# Patient Record
Sex: Female | Born: 1990 | Race: Black or African American | Hispanic: No | Marital: Single | Smoking: Never smoker
Health system: Southern US, Community
[De-identification: ages and names within clinical notes are randomized; demographics above are authoritative.]

## PROBLEM LIST (undated history)

## (undated) DIAGNOSIS — A6000 Herpesviral infection of urogenital system, unspecified: Secondary | ICD-10-CM

## (undated) HISTORY — PX: OTHER SURGICAL HISTORY: SHX169

## (undated) HISTORY — DX: Herpesviral infection of urogenital system, unspecified: A60.00

---
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
## 2013-07-29 ENCOUNTER — Emergency Department: Payer: Self-pay | Admitting: Emergency Medicine

## 2013-07-29 LAB — CBC
HGB: 13 g/dL (ref 12.0–16.0)
MCH: 25.5 pg — ABNORMAL LOW (ref 26.0–34.0)
MCV: 80 fL (ref 80–100)
RDW: 15 % — ABNORMAL HIGH (ref 11.5–14.5)

## 2013-07-29 LAB — WET PREP, GENITAL

## 2013-07-29 LAB — URINALYSIS, COMPLETE
Bilirubin,UR: NEGATIVE
Blood: NEGATIVE
Ketone: NEGATIVE
Nitrite: NEGATIVE
Protein: NEGATIVE
RBC,UR: 3 /HPF (ref 0–5)
Specific Gravity: 1.015 (ref 1.003–1.030)
WBC UR: 67 /HPF (ref 0–5)

## 2013-07-29 LAB — COMPREHENSIVE METABOLIC PANEL
Albumin: 4.3 g/dL (ref 3.4–5.0)
Alkaline Phosphatase: 73 U/L (ref 50–136)
Anion Gap: 5 — ABNORMAL LOW (ref 7–16)
Bilirubin,Total: 0.2 mg/dL (ref 0.2–1.0)
Calcium, Total: 9.5 mg/dL (ref 8.5–10.1)
Chloride: 103 mmol/L (ref 98–107)
Co2: 27 mmol/L (ref 21–32)
Creatinine: 0.78 mg/dL (ref 0.60–1.30)
EGFR (African American): >60
SGOT(AST): 20 U/L (ref 15–37)
Sodium: 135 mmol/L — ABNORMAL LOW (ref 136–145)

## 2013-07-29 LAB — LIPASE, BLOOD: Lipase: 174 U/L (ref 73–393)

## 2013-07-31 LAB — URINE CULTURE

## 2013-08-21 ENCOUNTER — Emergency Department: Payer: Self-pay | Admitting: Emergency Medicine

## 2013-08-22 LAB — URINALYSIS, COMPLETE
Bilirubin,UR: NEGATIVE
Glucose,UR: NEGATIVE mg/dL (ref 0–75)
Ketone: NEGATIVE
Nitrite: NEGATIVE
Specific Gravity: 1.027 (ref 1.003–1.030)
WBC UR: 23 /HPF (ref 0–5)

## 2013-08-23 LAB — URINE CULTURE

## 2014-03-18 ENCOUNTER — Emergency Department: Payer: Self-pay | Admitting: Emergency Medicine

## 2014-03-18 LAB — URINALYSIS, COMPLETE
Bilirubin,UR: NEGATIVE
Blood: NEGATIVE
GLUCOSE, UR: NEGATIVE mg/dL (ref 0–75)
Ketone: NEGATIVE
Leukocyte Esterase: NEGATIVE
NITRITE: NEGATIVE
PH: 6 (ref 4.5–8.0)
PROTEIN: NEGATIVE
SPECIFIC GRAVITY: 1.009 (ref 1.003–1.030)
Squamous Epithelial: 5
WBC UR: 1 /HPF (ref 0–5)

## 2014-03-23 ENCOUNTER — Emergency Department: Payer: Self-pay | Admitting: Emergency Medicine

## 2014-03-23 LAB — CBC WITH DIFFERENTIAL/PLATELET
BASOS ABS: 0 10*3/uL (ref 0.0–0.1)
Basophil %: 0.4 %
EOS PCT: 2.5 %
Eosinophil #: 0.2 10*3/uL (ref 0.0–0.7)
HCT: 38.8 % (ref 35.0–47.0)
HGB: 12.4 g/dL (ref 12.0–16.0)
Lymphocyte #: 2.9 10*3/uL (ref 1.0–3.6)
Lymphocyte %: 31.6 %
MCH: 26 pg (ref 26.0–34.0)
MCHC: 31.9 g/dL — AB (ref 32.0–36.0)
MCV: 82 fL (ref 80–100)
MONOS PCT: 8.5 %
Monocyte #: 0.8 x10 3/mm (ref 0.2–0.9)
Neutrophil #: 5.2 10*3/uL (ref 1.4–6.5)
Neutrophil %: 57 %
PLATELETS: 312 10*3/uL (ref 150–440)
RBC: 4.76 10*6/uL (ref 3.80–5.20)
RDW: 16.3 % — ABNORMAL HIGH (ref 11.5–14.5)
WBC: 9.2 10*3/uL (ref 3.6–11.0)

## 2014-03-23 LAB — COMPREHENSIVE METABOLIC PANEL
ALBUMIN: 3.6 g/dL (ref 3.4–5.0)
ALK PHOS: 43 U/L — AB
ALT: 16 U/L (ref 12–78)
AST: 14 U/L — AB (ref 15–37)
Anion Gap: 7 (ref 7–16)
BUN: 6 mg/dL — AB (ref 7–18)
Bilirubin,Total: 0.2 mg/dL (ref 0.2–1.0)
CO2: 25 mmol/L (ref 21–32)
Calcium, Total: 8.5 mg/dL (ref 8.5–10.1)
Chloride: 105 mmol/L (ref 98–107)
Creatinine: 0.45 mg/dL — ABNORMAL LOW (ref 0.60–1.30)
GLUCOSE: 89 mg/dL (ref 65–99)
OSMOLALITY: 271 (ref 275–301)
Potassium: 3.8 mmol/L (ref 3.5–5.1)
Sodium: 137 mmol/L (ref 136–145)
Total Protein: 7.8 g/dL (ref 6.4–8.2)

## 2014-03-23 LAB — HCG, QUANTITATIVE, PREGNANCY: Beta Hcg, Quant.: 8445 m[IU]/mL — ABNORMAL HIGH

## 2015-10-25 IMAGING — US US OB < 14 WEEKS - US OB TV
1 series · 13 of 28 positions shown · non-contrast
Comparison: None.

CLINICAL DATA: Vaginal bleeding since this morning. Abdominal
discomfort. Estimated gestational age by LMP is 12 weeks 1 day.
Quantitative beta HCG is [DATE].

EXAM:
OBSTETRIC <14 WK US AND TRANSVAGINAL OB US
TECHNIQUE: Both transabdominal and transvaginal ultrasound examinations were
performed for complete evaluation of the gestation as well as the
maternal uterus, adnexal regions, and pelvic cul-de-sac.
Transvaginal technique was performed to assess early pregnancy.

[Series 1: us ob < 14 weeks - us ob tv · 0.22mm/px · 13 of 93 slices shown]
[im 4/93]
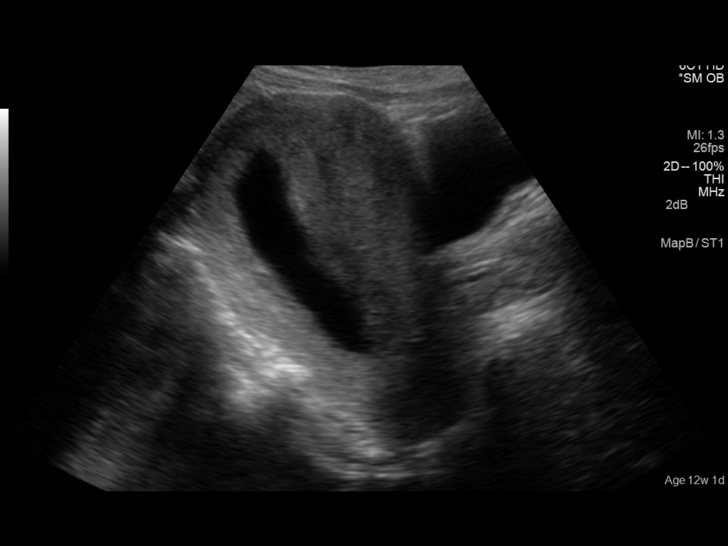
[im 11/93]
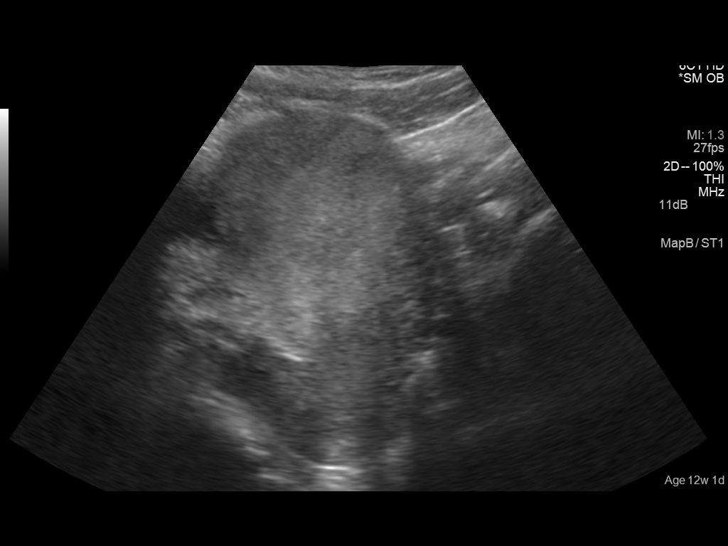
[im 18/93]
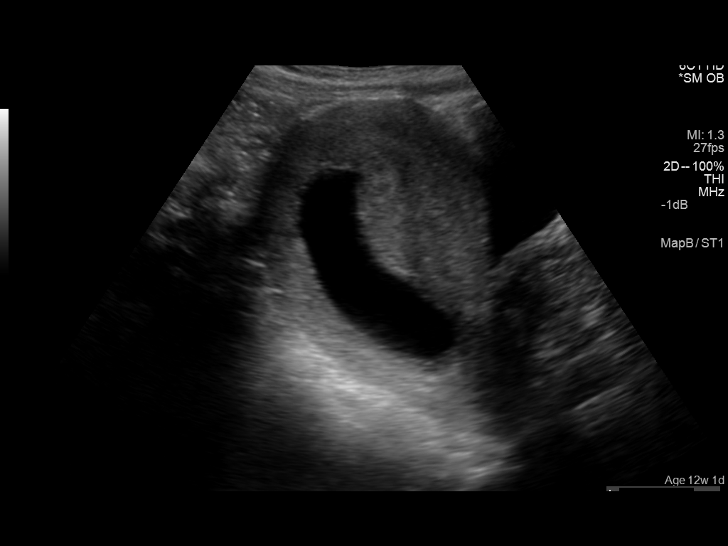
[im 24/93]
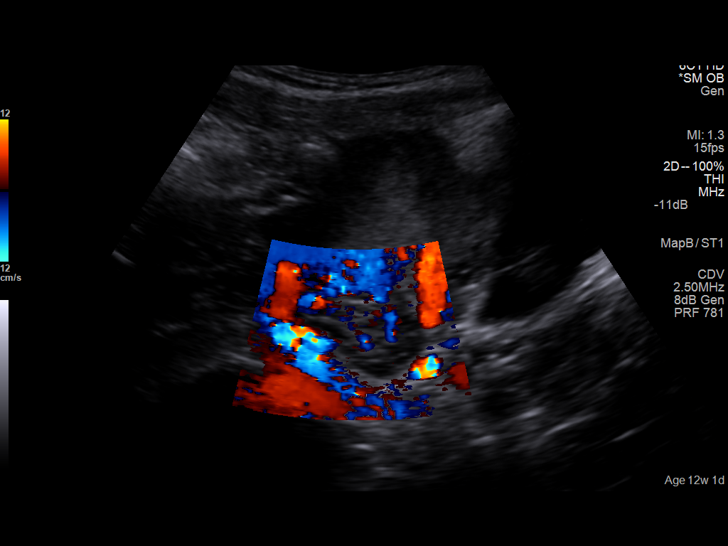
[im 31/93]
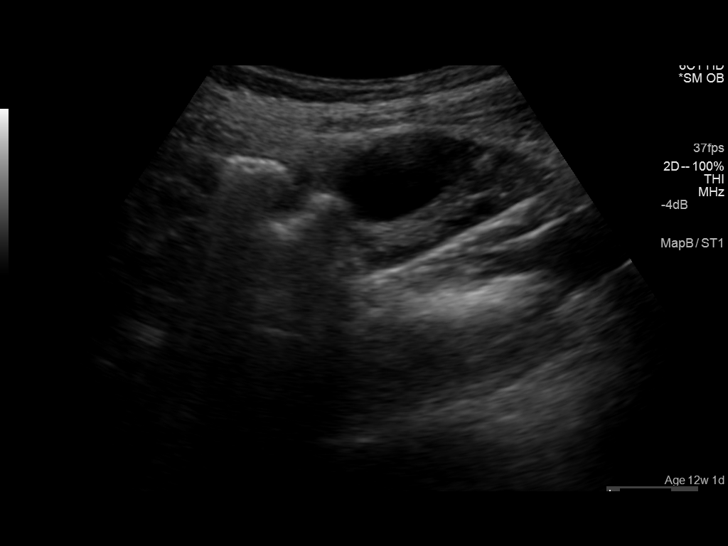
[im 38/93]
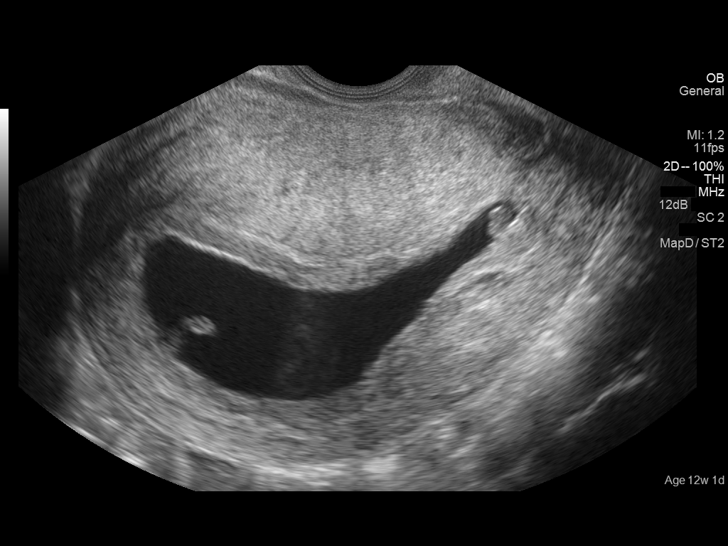
[im 48/93]
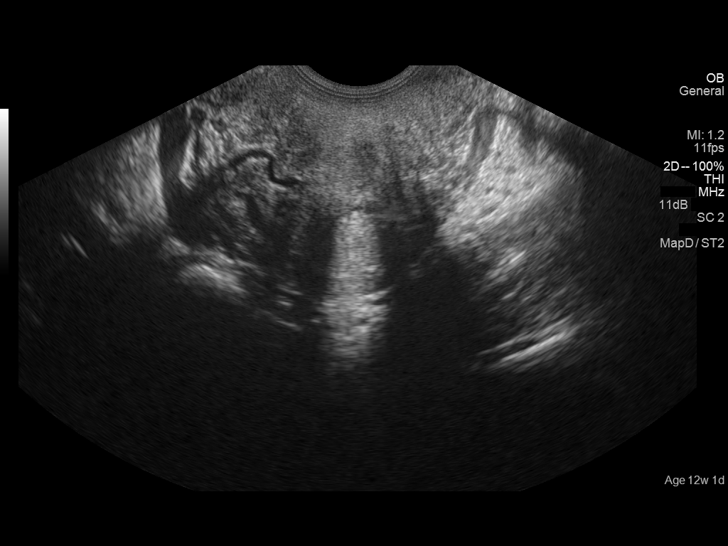
[im 55/93]
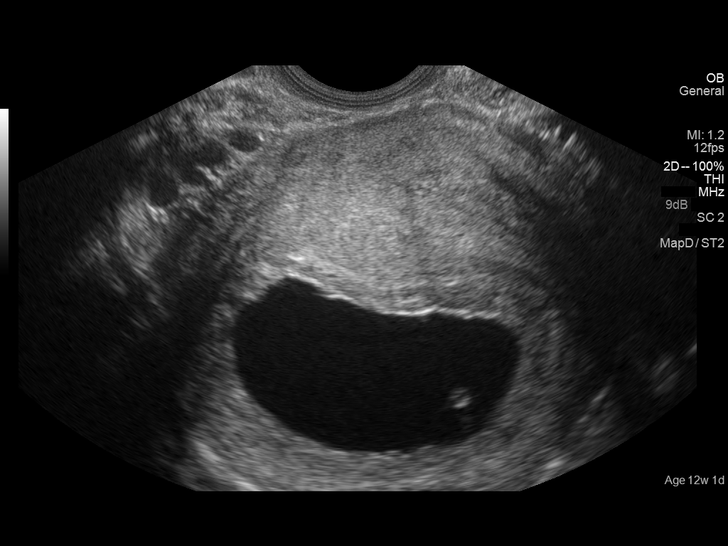
[im 62/93]
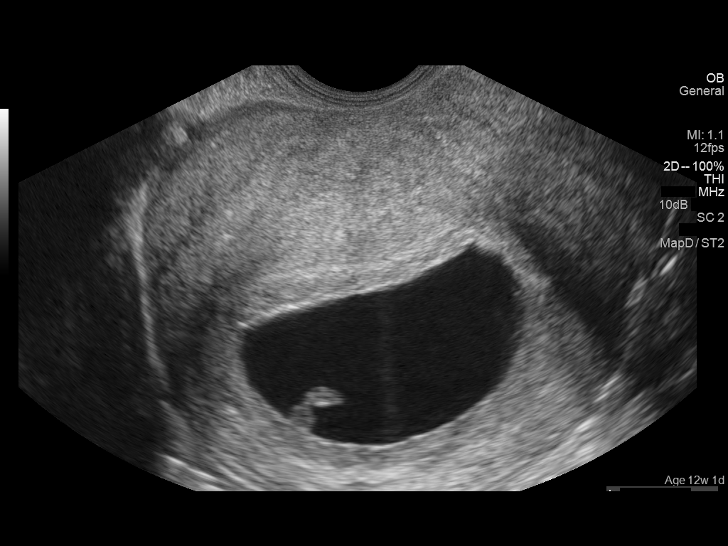
[im 69/93]
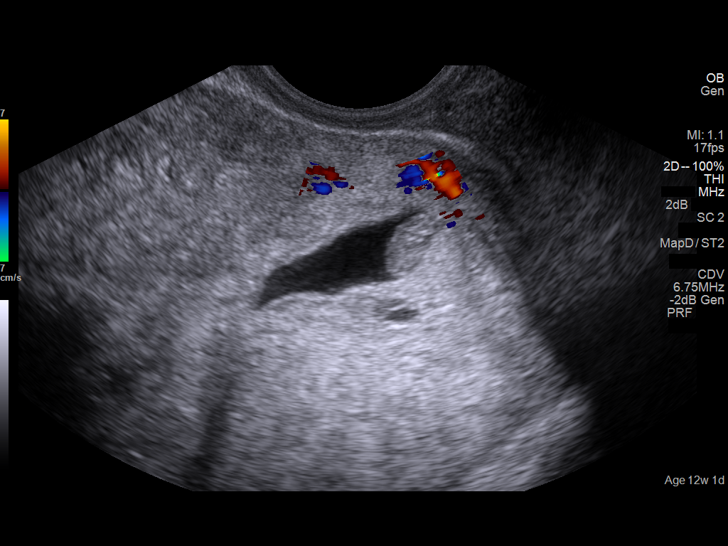
[im 75/93]
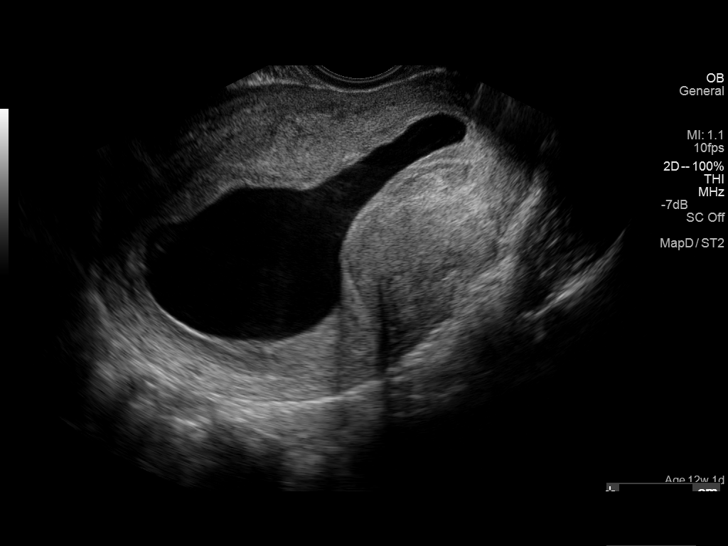
[im 82/93]
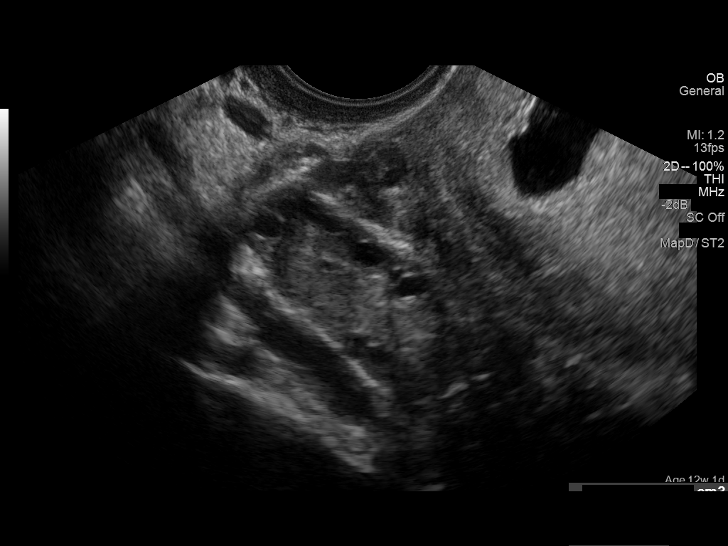
[im 89/93]
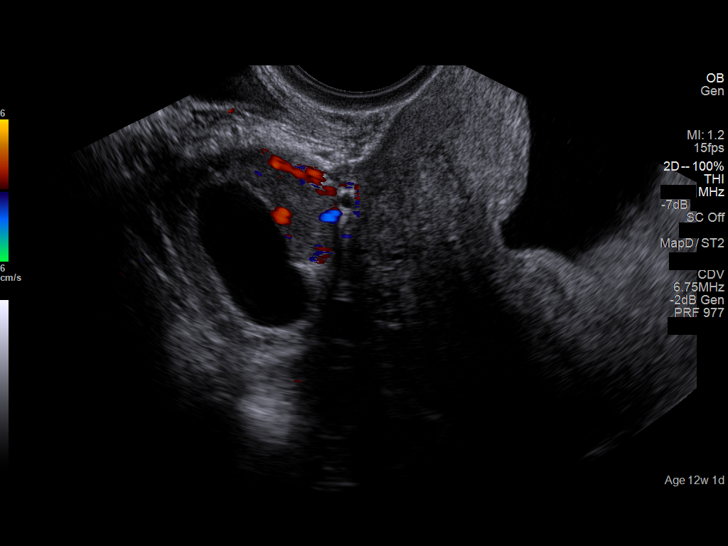

[13 of 28 positions shown; findings below may reference images not displayed]

FINDINGS: Intrauterine gestational sac: Single intrauterine gestational sac is
visualized. Sac appears to extend down into the lower uterine
segment with a filling defect suggesting a blood clot.

Yolk sac:  Probable yolk sac demonstrated.

Embryo:  Fetal pole is identified.

Cardiac Activity: No fetal cardiac activity identified.

MSD:  48  mm   10 w   3 d        US EDC:  10/17/2014

CRL:   6  mm   6 w 3 d                  US EDC: 11/13/2014

Maternal uterus/adnexae: No subchorionic hemorrhage. Both ovaries
are visualized with normal follicular changes present. Simple
appearing cyst in the left ovary measuring 2.8 cm maximal diameter,
likely benign.
IMPRESSION: Fetal pole demonstrated, small for LMP and small for mean sac
diameter. No fetal cardiac activity identified. Findings are
suspicious but not yet definitive for failed pregnancy. Recommend
follow-up US in 10-14 days for definitive diagnosis. This
recommendation follows SRU consensus guidelines: Diagnostic Criteria
for Nonviable Pregnancy Early in the First Trimester. N Engl J Med

## 2016-07-10 ENCOUNTER — Emergency Department
Admission: EM | Admit: 2016-07-10 | Discharge: 2016-07-10 | Disposition: A | Discharge status: Home / Self Care | Payer: Worker's Compensation | Attending: Emergency Medicine | Admitting: Emergency Medicine

## 2016-07-10 ENCOUNTER — Encounter: Payer: Self-pay | Admitting: *Deleted

## 2016-07-10 DIAGNOSIS — M778 Other enthesopathies, not elsewhere classified: Secondary | ICD-10-CM | POA: Diagnosis not present

## 2016-07-10 DIAGNOSIS — M25531 Pain in right wrist: Secondary | ICD-10-CM | POA: Diagnosis present

## 2016-07-10 MED ORDER — MELOXICAM 15 MG PO TABS: 15.0000 mg | ORAL_TABLET | Freq: Every day | ORAL | 0 refills | Status: DC

## 2016-07-10 NOTE — ED Triage Notes (Signed)
Pt has pain in left wrist.  Pt also reports pain in right wrist.  States does a lot of same movement at work.  States WC.  No swelling noted.

## 2016-07-10 NOTE — ED Notes (Signed)
Pt present with pain in left wrist.  Pt reports pain in right wrist.  Pt states does a lot of same movement at work.  Pt requesting to file WC.  No swelling noted.

## 2016-07-10 NOTE — Discharge Instructions (Signed)
Follow up with orthopedics for symptoms that are not improving over the week. °Return to the ER for symptoms that change or worsen if unable to schedule an appointment. °

## 2016-07-10 NOTE — ED Provider Notes (Signed)
[facility] Emergency Department Provider Note ____________________________________________  Time seen: Approximately 11:07 PM  I have reviewed the triage vital signs and the nursing notes.   HISTORY  Chief Complaint Wrist Pain    HPI Karen Palmer is a 25 y.o. female who presents to the emergency department for evaluation of bilateral wrist pain. She denies specific injury. She states that she does a lot of repetitive motion at work.She denies specific injury. She requests a note to be moved to a different area at work. She has not taken anything for pain.  No past medical history on file.  There are no active problems to display for this patient.   No past surgical history on file.  Prior to Admission medications   Medication Sig Start Date End Date Taking? Authorizing Provider  meloxicam (MOBIC) 15 MG tablet Take 1 tablet (15 mg total) by mouth daily. 07/10/16   Chinita Pester, FNP    Allergies Review of patient's allergies indicates no known allergies.  No family history on file.  Social History Social History  Substance Use Topics  . Smoking status: Never Smoker  . Smokeless tobacco: Never Used  . Alcohol use No    Review of Systems Constitutional: No recent illness. Cardiovascular: Denies chest pain or palpitations. Respiratory: Denies shortness of breath. Musculoskeletal: Pain in bilateral wrists, worse on the left. Skin: Negative for rash, wound, lesion. Neurological: Negative for focal weakness or numbness.  ____________________________________________   PHYSICAL EXAM:  VITAL SIGNS: ED Triage Vitals [07/10/16 2252]  Enc Vitals Group     BP (!) 127/92     Pulse Rate 60     Resp 16     Temp 98.4 F (36.9 C)     Temp Source Oral     SpO2 99 %     Weight 125 lb (56.7 kg)     Height 5\' 1"  (1.549 m)     Head Circumference      Peak Flow      Pain Score      Pain Loc      Pain Edu?      Excl. in GC?      Constitutional: Alert and oriented. Well appearing and in no acute distress. Eyes: Conjunctivae are normal. EOMI. Head: Atraumatic. Neck: No stridor.  Respiratory: Normal respiratory effort.   Musculoskeletal: Full ROM of bilateral wrists and hands. Pain reproducible by rotation of the left wrist and flexing left hand. Neurologic:  Normal speech and language. No gross focal neurologic deficits are appreciated. Speech is normal. No gait instability. Skin:  Skin is warm, dry and intact. Atraumatic. No swelling Psychiatric: Mood and affect are normal. Speech and behavior are normal.  ____________________________________________   LABS (all labs ordered are listed, but only abnormal results are displayed)  Labs Reviewed - No data to display ____________________________________________  RADIOLOGY  Not indicated ____________________________________________   PROCEDURES  Procedure(s) performed: Velcro wrist splint applied to the left wrist by ER tech.   ____________________________________________   INITIAL IMPRESSION / ASSESSMENT AND PLAN / ED COURSE  Clinical Course    Pertinent labs & imaging results that were available during my care of the patient were reviewed by me and considered in my medical decision making (see chart for details).  Patient will be prescribed meloxicam for pain. She was advised to follow-up with orthopedics for symptoms that are not improving over the week. She was advised to return to the emergency department for symptoms that change or worsen if  she is unable schedule an appointment with primary care orthopedics. ____________________________________________   FINAL CLINICAL IMPRESSION(S) / ED DIAGNOSES  Final diagnoses:  Tendonitis of both wrists       Chinita PesterCari B Greggory Safranek, FNP 07/10/16 [id number]    Minna AntisKevin Paduchowski, MD 07/11/16 [phone]

## 2016-07-10 NOTE — ED Notes (Signed)
Discharge instructions reviewed with patient. Questions fielded by this RN. Patient verbalizes understanding of instructions. Patient discharged home in stable condition per Triplett NP. No acute distress noted at time of discharge.   

## 2016-07-10 NOTE — ED Notes (Signed)
Worker's comp completed. Patient name and date of birth verified. Urine processed successfully and taken to lab.

## 2017-01-25 ENCOUNTER — Encounter: Payer: Self-pay | Admitting: Emergency Medicine

## 2017-01-25 ENCOUNTER — Emergency Department
Admission: EM | Admit: 2017-01-25 | Discharge: 2017-01-25 | Disposition: A | Discharge status: Home / Self Care | Payer: Self-pay | Attending: Emergency Medicine | Admitting: Emergency Medicine

## 2017-01-25 DIAGNOSIS — Z79899 Other long term (current) drug therapy: Secondary | ICD-10-CM | POA: Insufficient documentation

## 2017-01-25 DIAGNOSIS — B852 Pediculosis, unspecified: Secondary | ICD-10-CM

## 2017-01-25 DIAGNOSIS — B850 Pediculosis due to Pediculus humanus capitis: Secondary | ICD-10-CM

## 2017-01-25 MED ORDER — PERMETHRIN 1 % EX LOTN: 1.0000 "application " | TOPICAL_LOTION | Freq: Once | CUTANEOUS | 0 refills | Status: AC

## 2017-01-25 NOTE — ED Provider Notes (Signed)
[facility] Emergency Department Provider Note  ____________________________________________  Time seen: Approximately 9:09 PM  I have reviewed the triage vital signs and the nursing notes.   HISTORY  Chief Complaint Head Lice    HPI Laurene FootmanShaquelia Ericsson is a 26 y.o. female who presents emergency department for complaint of possible lice. She reports that her daughter was sent home with a note from school stating that child had lice. She has looked at her daughter's head and has visualized lice and nits. Patient reports that she has visualized notes in her own here. She reports that her scalp itches. No complaints at this time. No medication prior to arrival.   History reviewed. No pertinent past medical history.  There are no active problems to display for this patient.   History reviewed. No pertinent surgical history.  Prior to Admission medications   Medication Sig Start Date End Date Taking? Authorizing Provider  meloxicam (MOBIC) 15 MG tablet Take 1 tablet (15 mg total) by mouth daily. 07/10/16   Chinita Pesterari B Triplett, FNP  permethrin (PERMETHRIN LICE TREATMENT) 1 % lotion Apply 1 application topically once. Shampoo, rinse and towel dry hair, saturate hair and scalp with permethrin. Use over other affected surfaces. Rinse after 10 min; repeat in 1 week if needed 01/25/17 01/25/17  Delorise RoyalsJonathan D London Nonaka, PA-C    Allergies Patient has no known allergies.  History reviewed. No pertinent family history.  Social History Social History  Substance Use Topics  . Smoking status: Never Smoker  . Smokeless tobacco: Never Used  . Alcohol use No     Review of Systems  Constitutional: No fever/chills Eyes: No visual changes. No discharge ENT: No upper respiratory complaints. Cardiovascular: no chest pain. Respiratory: no cough. No SOB. Gastrointestinal: No abdominal pain.  No nausea, no vomiting.  Musculoskeletal: Negative for musculoskeletal pain. Skin: She  reports lice and nits to hear. Neurological: Negative for headaches, focal weakness or numbness. 10-point ROS otherwise negative.  ____________________________________________   PHYSICAL EXAM:  VITAL SIGNS: ED Triage Vitals [01/25/17 2035]  Enc Vitals Group     BP 124/80     Pulse Rate 65     Resp 16     Temp 98.3 F (36.8 C)     Temp Source Oral     SpO2 99 %     Weight 130 lb (59 kg)     Height 5\' 1"  (1.549 m)     Head Circumference      Peak Flow      Pain Score      Pain Loc      Pain Edu?      Excl. in GC?      Constitutional: Alert and oriented. Well appearing and in no acute distress. Eyes: Conjunctivae are normal. PERRL. EOMI. Head: Atraumatic. Visualization of the scalp reveals both lice and nits. ENT:      Ears:       Nose: No congestion/rhinnorhea.      Mouth/Throat: Mucous membranes are moist.  Neck: No stridor.    Cardiovascular: Normal rate, regular rhythm. Normal S1 and S2.  Good peripheral circulation. Respiratory: Normal respiratory effort without tachypnea or retractions. Lungs CTAB. Good air entry to the bases with no decreased or absent breath sounds. Musculoskeletal: Full range of motion to all extremities. No gross deformities appreciated. Neurologic:  Normal speech and language. No gross focal neurologic deficits are appreciated.  Skin:  Skin is warm, dry and intact. No rash noted. Psychiatric: Mood and affect are  normal. Speech and behavior are normal. Patient exhibits appropriate insight and judgement.   ____________________________________________   LABS (all labs ordered are listed, but only abnormal results are displayed)  Labs Reviewed - No data to display ____________________________________________  EKG   ____________________________________________  RADIOLOGY   No results found.  ____________________________________________    PROCEDURES  Procedure(s) performed:    Procedures    Medications - No data to  display   ____________________________________________   INITIAL IMPRESSION / ASSESSMENT AND PLAN / ED COURSE  Pertinent labs & imaging results that were available during my care of the patient were reviewed by me and considered in my medical decision making (see chart for details).  Review of the [place] CSRS was performed in accordance of the [facility] prior to dispensing any controlled drugs.     Patient's diagnosis is consistent with lice infected hair.. Patient will be discharged home with prescriptions for permethrin. Patient is to follow up with primary care as needed or otherwise directed. Patient is given ED precautions to return to the ED for any worsening or new symptoms.     ____________________________________________  FINAL CLINICAL IMPRESSION(S) / ED DIAGNOSES  Final diagnoses:  Lice infested hair      NEW MEDICATIONS STARTED DURING THIS VISIT:  Discharge Medication List as of 01/25/2017  9:10 PM    START taking these medications   Details  permethrin (PERMETHRIN LICE TREATMENT) 1 % lotion Apply 1 application topically once. Shampoo, rinse and towel dry hair, saturate hair and scalp with permethrin. Use over other affected surfaces. Rinse after 10 min; repeat in 1 week if needed, Starting Thu 01/25/2017, Print            This chart was dictated using voice recognition software/Dragon. Despite best efforts to proofread, errors can occur which can change the meaning. Any change was purely unintentional.    Racheal Patches, PA-C 01/25/17 2328    Jene Every, MD 01/29/17 [phone]

## 2017-01-25 NOTE — ED Triage Notes (Signed)
Pt ambulatory to tx room in NAD, report daughter sent home w/ note that other student found to have lice and family needs to be checked out

## 2018-04-13 DIAGNOSIS — A6000 Herpesviral infection of urogenital system, unspecified: Secondary | ICD-10-CM

## 2018-04-13 HISTORY — DX: Herpesviral infection of urogenital system, unspecified: A60.00

## 2018-05-06 ENCOUNTER — Ambulatory Visit (INDEPENDENT_AMBULATORY_CARE_PROVIDER_SITE_OTHER): Payer: Commercial Managed Care - PPO | Admitting: Obstetrics and Gynecology

## 2018-05-06 ENCOUNTER — Encounter: Payer: Self-pay | Admitting: Obstetrics and Gynecology

## 2018-05-06 VITALS — BP 118/80 | Ht 61.0 in | Wt 141.0 lb

## 2018-05-06 DIAGNOSIS — N898 Other specified noninflammatory disorders of vagina: Secondary | ICD-10-CM | POA: Diagnosis not present

## 2018-05-06 LAB — POCT WET PREP WITH KOH
Clue Cells Wet Prep HPF POC: NEGATIVE
KOH PREP POC: NEGATIVE
Trichomonas, UA: NEGATIVE
YEAST WET PREP PER HPF POC: NEGATIVE

## 2018-05-06 MED ORDER — VALACYCLOVIR HCL 1 G PO TABS: 1000.0000 mg | ORAL_TABLET | Freq: Two times a day (BID) | ORAL | 0 refills | Status: AC

## 2018-05-06 NOTE — Patient Instructions (Signed)
I value your feedback and entrusting us with your care. If you get a [facility] patient survey, I would appreciate you taking the time to let us know about your experience today. Thank you! 

## 2018-05-06 NOTE — Progress Notes (Signed)
System, Pcp Not In   Chief Complaint  Patient presents with  . Vaginal Pain    HPI:      Karen Palmer is a 27 y.o. No obstetric history on file. who LMP was Patient's last menstrual period was 04/16/2018., presents today for NP eval of increased vag d/c, with vaginal cuts and swelling. No odor. Sx for a wk. Had similar sx in the past that resolved quickly. Treated with mupirocin crm once with some improvement. No LBP, belly pain, fevers. No recent abx use. No hx of HSV. She is sex active, no BC. Conception ok. Not taking PNVs.   No recent annual.   History reviewed. No pertinent past medical history.  History reviewed. No pertinent surgical history.  Family History  Problem Relation Age of Onset  . Hypertension Sister   . Diabetes Maternal Grandmother   . Hypertension Maternal Grandmother     Social History   Socioeconomic History  . Marital status: Single    Spouse name: Not on file  . Number of children: Not on file  . Years of education: Not on file  . Highest education level: Not on file  Occupational History  . Not on file  Social Needs  . Financial resource strain: Not on file  . Food insecurity:    Worry: Not on file    Inability: Not on file  . Transportation needs:    Medical: Not on file    Non-medical: Not on file  Tobacco Use  . Smoking status: Never Smoker  . Smokeless tobacco: Never Used  Substance and Sexual Activity  . Alcohol use: No  . Drug use: Never  . Sexual activity: Yes    Birth control/protection: None  Lifestyle  . Physical activity:    Days per week: Not on file    Minutes per session: Not on file  . Stress: Not on file  Relationships  . Social connections:    Talks on phone: Not on file    Gets together: Not on file    Attends religious service: Not on file    Active member of club or organization: Not on file    Attends meetings of clubs or organizations: Not on file    Relationship status: Not on file  .  Intimate partner violence:    Fear of current or ex partner: Not on file    Emotionally abused: Not on file    Physically abused: Not on file    Forced sexual activity: Not on file  Other Topics Concern  . Not on file  Social History Narrative  . Not on file    Outpatient Medications Prior to Visit  Medication Sig Dispense Refill  . meloxicam (MOBIC) 15 MG tablet Take 1 tablet (15 mg total) by mouth daily. (Patient not taking: Reported on 05/06/2018) 30 tablet 0   No facility-administered medications prior to visit.       ROS:  Review of Systems  Constitutional: Negative for fatigue, fever and unexpected weight change.  Respiratory: Negative for cough, shortness of breath and wheezing.   Cardiovascular: Negative for chest pain, palpitations and leg swelling.  Gastrointestinal: Negative for blood in stool, constipation, diarrhea, nausea and vomiting.  Endocrine: Negative for cold intolerance, heat intolerance and polyuria.  Genitourinary: Positive for genital sores and vaginal discharge. Negative for dyspareunia, dysuria, flank pain, frequency, hematuria, menstrual problem, pelvic pain, urgency, vaginal bleeding and vaginal pain.  Musculoskeletal: Negative for back pain, joint swelling and myalgias.  Skin: Negative for rash.  Neurological: Negative for dizziness, syncope, light-headedness, numbness and headaches.  Hematological: Negative for adenopathy.  Psychiatric/Behavioral: Negative for agitation, confusion, sleep disturbance and suicidal ideas. The patient is not nervous/anxious.    BREAST: No symptoms   OBJECTIVE:   Vitals:  BP 118/80   Ht 5\' 1"  (1.549 m)   Wt 141 lb (64 kg)   LMP 04/16/2018   BMI 26.64 kg/m   Physical Exam  Constitutional: She is oriented to person, place, and time. Vital signs are normal. She appears well-developed.  Pulmonary/Chest: Effort normal.  Genitourinary: Uterus normal.    There is lesion on the right labia. There is no rash or  tenderness on the right labia. There is lesion on the left labia. There is no rash or tenderness on the left labia. Uterus is not enlarged and not tender. Cervix exhibits no motion tenderness. Right adnexum displays no mass and no tenderness. Left adnexum displays no mass and no tenderness. No erythema or tenderness in the vagina. Vaginal discharge found.  Genitourinary Comments: TENDER FISSURES BILAT LABIA MAJORA TOWARD PERINEAL AREA; VERY TENDER ON EXAM; NO SPECIFIC ULCERS  Musculoskeletal: Normal range of motion.  Neurological: She is alert and oriented to person, place, and time.  Psychiatric: She has a normal mood and affect. Her behavior is normal. Thought content normal.  Vitals reviewed.   Results: Results for orders placed or performed in visit on 05/06/18 (from the past 24 hour(s))  POCT Wet Prep with KOH     Status: Normal   Collection Time: 05/06/18 10:05 AM  Result Value Ref Range   Trichomonas, UA Negative    Clue Cells Wet Prep HPF POC neg    Epithelial Wet Prep HPF POC  Few, Moderate, Many, Too numerous to count   Yeast Wet Prep HPF POC neg    Bacteria Wet Prep HPF POC  Few   RBC Wet Prep HPF POC     WBC Wet Prep HPF POC     KOH Prep POC Negative Negative     Assessment/Plan: Vaginal lesion - Question herpetic vs fungal. Check herpes culture/HSV2 IgG. Start valtrex. Rx eRxd. Will f/u with results.  - Plan: HSV 2 antibody, IgG, valACYclovir (VALTREX) 1000 MG tablet, Other/Misc lab test  Vaginal discharge - Neg wet prep. Reassurance.  - Plan: POCT Wet Prep with KOH    Meds ordered this encounter  Medications  . valACYclovir (VALTREX) 1000 MG tablet    Sig: Take 1 tablet (1,000 mg total) by mouth 2 (two) times daily for 10 days.    Dispense:  20 tablet    Refill:  0    Order Specific Question:   Supervising Provider    Answer:   Nadara MustardHARRIS, ROBERT P [[id number]]      Return in about 1 month (around 06/05/2018) for annual.  Alicia B. Copland, PA-C 05/06/2018 10:09  AM

## 2018-05-07 LAB — HSV 2 ANTIBODY, IGG: HSV 2 IgG, Type Spec: <0.91 index (ref 0.00–0.90)

## 2018-05-20 ENCOUNTER — Encounter: Payer: Self-pay | Admitting: Obstetrics and Gynecology

## 2018-05-20 NOTE — Telephone Encounter (Signed)
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 Sx resolved. F/u prn.

## 2018-06-03 ENCOUNTER — Ambulatory Visit: Payer: Commercial Managed Care - PPO | Admitting: Obstetrics and Gynecology

## 2019-09-17 ENCOUNTER — Ambulatory Visit (INDEPENDENT_AMBULATORY_CARE_PROVIDER_SITE_OTHER): Payer: Commercial Managed Care - PPO | Admitting: Obstetrics and Gynecology

## 2019-09-17 ENCOUNTER — Encounter: Payer: Self-pay | Admitting: Obstetrics and Gynecology

## 2019-09-17 ENCOUNTER — Other Ambulatory Visit: Payer: Self-pay

## 2019-09-17 VITALS — BP 132/70 | HR 87 | Ht 61.0 in | Wt 151.5 lb

## 2019-09-17 DIAGNOSIS — N912 Amenorrhea, unspecified: Secondary | ICD-10-CM

## 2019-09-17 NOTE — Progress Notes (Signed)
Patient comes in today for pregnancy confirmation.  

## 2019-09-17 NOTE — Progress Notes (Signed)
HPI:      [name] is a 28 y.o. G3P1011 who LMP was Patient's last menstrual period was 07/20/2019.  Subjective:   She presents today after missing her menstrual period.  She believes she may be pregnant.  She has occasional morning sickness but is mostly keeping food down. She reports her last pregnancy as uncomplicated spontaneous labor at term.  Baby was small at 5 pounds 13 ounces. She is not yet taking prenatal vitamins.    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 She does not have a problem list on file. She  has no past surgical history on file. Her family history includes Diabetes in her maternal grandmother; Hypertension in her maternal grandmother and sister. She  reports that she has never smoked. She has never used smokeless tobacco. She reports that she does not drink alcohol or use drugs. She currently has no medications in their medication list. She has No Known Allergies.       Review of Systems:  Review of Systems  Constitutional: Denied constitutional symptoms, night sweats, recent illness, fatigue, fever, insomnia and weight loss.  Eyes: Denied eye symptoms, eye pain, photophobia, vision change and visual disturbance.  Ears/Nose/Throat/Neck: Denied ear, nose, throat or neck symptoms, hearing loss, nasal discharge, sinus congestion and sore throat.  Cardiovascular: Denied cardiovascular symptoms, arrhythmia, chest pain/pressure, edema, exercise intolerance, orthopnea and palpitations.  Respiratory: Denied pulmonary symptoms, asthma, pleuritic pain, productive sputum, cough, dyspnea and wheezing.  Gastrointestinal: Denied, gastro-esophageal reflux, melena, nausea and vomiting.  Genitourinary: Denied genitourinary symptoms including symptomatic vaginal discharge, pelvic relaxation issues, and urinary complaints.  Musculoskeletal: Denied  musculoskeletal symptoms, stiffness, swelling, muscle weakness and myalgia.  Dermatologic: Denied dermatology symptoms, rash and scar.  Neurologic: Denied neurology symptoms, dizziness, headache, neck pain and syncope.  Psychiatric: Denied psychiatric symptoms, anxiety and depression.  Endocrine: Denied endocrine symptoms including hot flashes and night sweats.   Meds:   No current outpatient medications on file prior to visit.   No current facility-administered medications on file prior to visit.     Objective:     Vitals:   09/17/19 0813  BP: 132/70  Pulse: 87              UPT positive  Assessment:    G3P1011 There are no active problems to display for this patient.    1. Amenorrhea     Approximately [redacted] weeks pregnant   Plan:            Prenatal Plan 1.  The patient was given prenatal literature. 2.  She was begun on prenatal vitamins. 3.  A prenatal lab panel was ordered or drawn. 4.  An ultrasound was ordered to better determine an EDC. 5.  A nurse visit was scheduled. 6.  Genetic testing and testing for other inheritable conditions discussed in detail. She will decide in the future whether to have these labs performed. 7.  A general overview of pregnancy testing, visit schedule, ultrasound schedule, and prenatal care was discussed. 8.  Benefits of breast-feeding discussed in detail including both maternal and infant benefits. Ready Set Baby website discussed.   Orders No orders of the defined types were placed in this encounter.   No orders of the defined types were placed in this encounter.     F/U  Return in  about 4 weeks (around 10/15/2019).  Elonda Husky, M.D. 09/17/2019 8:49 AM

## 2019-09-24 ENCOUNTER — Other Ambulatory Visit: Payer: Self-pay

## 2019-09-24 ENCOUNTER — Other Ambulatory Visit: Payer: Self-pay | Admitting: Internal Medicine

## 2019-09-24 ENCOUNTER — Ambulatory Visit (INDEPENDENT_AMBULATORY_CARE_PROVIDER_SITE_OTHER): Payer: Commercial Managed Care - PPO

## 2019-09-24 DIAGNOSIS — Z3491 Encounter for supervision of normal pregnancy, unspecified, first trimester: Secondary | ICD-10-CM

## 2019-09-29 ENCOUNTER — Other Ambulatory Visit: Payer: Self-pay

## 2019-09-29 ENCOUNTER — Ambulatory Visit (INDEPENDENT_AMBULATORY_CARE_PROVIDER_SITE_OTHER): Payer: Commercial Managed Care - PPO | Admitting: Obstetrics and Gynecology

## 2019-09-29 VITALS — BP 128/81 | HR 84 | Ht 61.0 in | Wt 157.5 lb

## 2019-09-29 DIAGNOSIS — Z113 Encounter for screening for infections with a predominantly sexual mode of transmission: Secondary | ICD-10-CM

## 2019-09-29 DIAGNOSIS — Z0283 Encounter for blood-alcohol and blood-drug test: Secondary | ICD-10-CM

## 2019-09-29 DIAGNOSIS — Z3481 Encounter for supervision of other normal pregnancy, first trimester: Secondary | ICD-10-CM

## 2019-09-29 NOTE — Patient Instructions (Addendum)
WHAT OB PATIENTS CAN EXPECT   Confirmation of pregnancy and ultrasound ordered if medically indicated-[redacted] weeks gestation  New OB (NOB) intake with nurse and New OB (NOB) labs- [redacted] weeks gestation  New OB (NOB) physical examination with provider- 11/[redacted] weeks gestation  Flu vaccine-[redacted] weeks gestation  Anatomy scan-[redacted] weeks gestation  Glucose tolerance test, blood work to test for anemia, T-dap vaccine-[redacted] weeks gestation  Vaginal swabs/cultures-STD/Group B strep-[redacted] weeks gestation  Appointments every 4 weeks until 28 weeks  Every 2 weeks from 28 weeks until 36 weeks  Weekly visits from 36 weeks until delivery   Prenatal Care Prenatal care is health care during pregnancy. It helps you and your unborn baby (fetus) stay as healthy as possible. Prenatal care may be provided by a midwife, a family practice health care provider, or a childbirth and pregnancy specialist (obstetrician). How does this affect me? During pregnancy, you will be closely monitored for any new conditions that might develop. To lower your risk of pregnancy complications, you and your health care provider will talk about any underlying conditions you have. How does this affect my baby? Early and consistent prenatal care increases the chance that your baby will be healthy during pregnancy. Prenatal care lowers the risk that your baby will be:  Born early (prematurely).  Smaller than expected at birth (small for gestational age). What can I expect at the first prenatal care visit? Your first prenatal care visit will likely be the longest. You should schedule your first prenatal care visit as soon as you know that you are pregnant. Your first visit is a good time to talk about any questions or concerns you have about pregnancy. At your visit, you and your health care provider will talk about:  Your medical history, including: ? Any past pregnancies. ? Your family's medical history. ? The baby's father's medical  history. ? Any long-term (chronic) health conditions you have and how you manage them. ? Any surgeries or procedures you have had. ? Any current over-the-counter or prescription medicines, herbs, or supplements you are taking.  Other factors that could pose a risk to your baby, including:  Your home setting and your stress levels, including: ? Exposure to abuse or violence. ? Household financial strain. ? Mental health conditions you have.  Your daily health habits, including diet and exercise. Your health care provider will also:  Measure your weight, height, and blood pressure.  Do a physical exam, including a pelvic and breast exam.  Perform blood tests and urine tests to check for: ? Urinary tract infection. ? Sexually transmitted infections (STIs). ? Low iron levels in your blood (anemia). ? Blood type and certain proteins on red blood cells (Rh antibodies). ? Infections and immunity to viruses, such as hepatitis B and rubella. ? HIV (human immunodeficiency virus).  Do an ultrasound to confirm your baby's growth and development and to help predict your estimated due date (EDD). This ultrasound is done with a probe that is inserted into the vagina (transvaginal ultrasound).  Discuss your options for genetic screening.  Give you information about how to keep yourself and your baby healthy, including: ? Nutrition and taking vitamins. ? Physical activity. ? How to manage pregnancy symptoms such as nausea and vomiting (morning sickness). ? Infections and substances that may be harmful to your baby and how to avoid them. ? Food safety. ? Dental care. ? Working. ? Travel. ? Warning signs to watch for and when to call your health care provider. How often  will I have prenatal care visits? After your first prenatal care visit, you will have regular visits throughout your pregnancy. The visit schedule is often as follows:  Up to week 28 of pregnancy: once every 4 weeks.  28-36  weeks: once every 2 weeks.  After 36 weeks: every week until delivery. Some women may have visits more or less often depending on any underlying health conditions and the health of the baby. Keep all follow-up and prenatal care visits as told by your health care provider. This is important. What happens during routine prenatal care visits? Your health care provider will:  Measure your weight and blood pressure.  Check for fetal heart sounds.  Measure the height of your uterus in your abdomen (fundal height). This may be measured starting around week 20 of pregnancy.  Check the position of your baby inside your uterus.  Ask questions about your diet, sleeping patterns, and whether you can feel the baby move.  Review warning signs to watch for and signs of labor.  Ask about any pregnancy symptoms you are having and how you are dealing with them. Symptoms may include: ? Headaches. ? Nausea and vomiting. ? Vaginal discharge. ? Swelling. ? Fatigue. ? Constipation. ? Any discomfort, including back or pelvic pain. Make a list of questions to ask your health care provider at your routine visits. What tests might I have during prenatal care visits? You may have blood, urine, and imaging tests throughout your pregnancy, such as:  Urine tests to check for glucose, protein, or signs of infection.  Glucose tests to check for a form of diabetes that can develop during pregnancy (gestational diabetes mellitus). This is usually done around week 24 of pregnancy.  An ultrasound to check your baby's growth and development and to check for birth defects. This is usually done around week 20 of pregnancy.  A test to check for group B strep (GBS) infection. This is usually done around week 36 of pregnancy.  Genetic testing. This may include blood or imaging tests, such as an ultrasound. Some genetic tests are done during the first trimester and some are done during the second trimester. What else  can I expect during prenatal care visits? Your health care provider may recommend getting certain vaccines during pregnancy. These may include:  A yearly flu shot (annual influenza vaccine). This is especially important if you will be pregnant during flu season.  Tdap (tetanus, diphtheria, pertussis) vaccine. Getting this vaccine during pregnancy can protect your baby from whooping cough (pertussis) after birth. This vaccine may be recommended between weeks 27 and 36 of pregnancy. Later in your pregnancy, your health care provider may give you information about:  Childbirth and breastfeeding classes.  Choosing a health care provider for your baby.  Umbilical cord banking.  Breastfeeding.  Birth control after your baby is born.  The hospital labor and delivery unit and how to tour it.  Registering at the hospital before you go into labor. Where to find more information  Office on Women's Health: LegalWarrants.gl  American Pregnancy Association: americanpregnancy.org  March of Dimes: marchofdimes.org Summary  Prenatal care helps you and your baby stay as healthy as possible during pregnancy.  Your first prenatal care visit will most likely be the longest.  You will have visits and tests throughout your pregnancy to monitor your health and your baby's health.  Bring a list of questions to your visits to ask your health care provider.  Make sure to keep all follow-up and  prenatal care visits with your health care provider. This information is not intended to replace advice given to you by your health care provider. Make sure you discuss any questions you have with your health care provider. Document Released: 11/02/2003 Document Revised: 02/19/2019 Document Reviewed: 10/29/2017 Elsevier Patient Education  [place].  Morning Sickness  Morning sickness is when you feel sick to your stomach (nauseous) during pregnancy. You may feel sick to your stomach and throw up  (vomit). You may feel sick in the morning, but you can feel this way at any time of day. Some women feel very sick to their stomach and cannot stop throwing up (hyperemesis gravidarum). Follow these instructions at home: Medicines  Take over-the-counter and prescription medicines only as told by your doctor. Do not take any medicines until you talk with your doctor about them first.  Taking multivitamins before getting pregnant can stop or lessen the harshness of morning sickness. Eating and drinking  Eat dry toast or crackers before getting out of bed.  Eat 5 or 6 small meals a day.  Eat dry and bland foods like rice and baked potatoes.  Do not eat greasy, fatty, or spicy foods.  Have someone cook for you if the smell of food causes you to feel sick or throw up.  If you feel sick to your stomach after taking prenatal vitamins, take them at night or with a snack.  Eat protein when you need a snack. Nuts, yogurt, and cheese are good choices.  Drink fluids throughout the day.  Try ginger ale made with real ginger, ginger tea made from fresh grated ginger, or ginger candies. General instructions  Do not use any products that have nicotine or tobacco in them, such as cigarettes and e-cigarettes. If you need help quitting, ask your doctor.  Use an air purifier to keep the air in your house free of smells.  Get lots of fresh air.  Try to avoid smells that make you feel sick.  Try: ? Wearing a bracelet that is used for seasickness (acupressure wristband). ? Going to a doctor who puts thin needles into certain body points (acupuncture) to improve how you feel. Contact a doctor if:  You need medicine to feel better.  You feel dizzy or light-headed.  You are losing weight. Get help right away if:  You feel very sick to your stomach and cannot stop throwing up.  You pass out (faint).  You have very bad pain in your belly. Summary  Morning sickness is when you feel sick to  your stomach (nauseous) during pregnancy.  You may feel sick in the morning, but you can feel this way at any time of day.  Making some changes to what you eat may help your symptoms go away. This information is not intended to replace advice given to you by your health care provider. Make sure you discuss any questions you have with your health care provider. Document Released: 12/07/2004 Document Revised: 10/12/2017 Document Reviewed: 11/30/2016 Elsevier Patient Education  2020 [employer].  How a Baby Grows During Pregnancy  Pregnancy begins when a female's sperm enters a female's egg (fertilization). Fertilization usually happens in one of the tubes (fallopian tubes) that connect the ovaries to the womb (uterus). The fertilized egg moves down the fallopian tube to the uterus. Once it reaches the uterus, it implants into the lining of the uterus and begins to grow. For the first 10 weeks, the fertilized egg is called an embryo. After  10 weeks, it is called a fetus. As the fetus continues to grow, it receives oxygen and nutrients through tissue (placenta) that grows to support the developing baby. The placenta is the life support system for the baby. It provides oxygen and nutrition and removes waste. Learning as much as you can about your pregnancy and how your baby is developing can help you enjoy the experience. It can also make you aware of when there might be a problem and when to ask questions. How long does a typical pregnancy last? A pregnancy usually lasts 280 days, or about 40 weeks. Pregnancy is divided into three periods of growth, also called trimesters:  First trimester: 0-12 weeks.  Second trimester: 13-27 weeks.  Third trimester: 28-40 weeks. The day when your baby is ready to be born (full term) is your estimated date of delivery. How does my baby develop month by month? First month  The fertilized egg attaches to the inside of the uterus.  Some cells will form the  placenta. Others will form the fetus.  The arms, legs, brain, spinal cord, lungs, and heart begin to develop.  At the end of the first month, the heart begins to beat. Second month  The bones, inner ear, eyelids, hands, and feet form.  The genitals develop.  By the end of 8 weeks, all major organs are developing. Third month  All of the internal organs are forming.  Teeth develop below the gums.  Bones and muscles begin to grow. The spine can flex.  The skin is transparent.  Fingernails and toenails begin to form.  Arms and legs continue to grow longer, and hands and feet develop.  The fetus is about 3 inches (7.6 cm) long. Fourth month  The placenta is completely formed.  The external sex organs, neck, outer ear, eyebrows, eyelids, and fingernails are formed.  The fetus can hear, swallow, and move its arms and legs.  The kidneys begin to produce urine.  The skin is covered with a white, waxy coating (vernix) and very fine hair (lanugo). Fifth month  The fetus moves around more and can be felt for the first time (quickening).  The fetus starts to sleep and wake up and may begin to suck its finger.  The nails grow to the end of the fingers.  The organ in the digestive system that makes bile (gallbladder) functions and helps to digest nutrients.  If your baby is a girl, eggs are present in her ovaries. If your baby is a boy, testicles start to move down into his scrotum. Sixth month  The lungs are formed.  The eyes open. The brain continues to develop.  Your baby has fingerprints and toe prints. Your baby's hair grows thicker.  At the end of the second trimester, the fetus is about 9 inches (22.9 cm) long. Seventh month  The fetus kicks and stretches.  The eyes are developed enough to sense changes in light.  The hands can make a grasping motion.  The fetus responds to sound. Eighth month  All organs and body systems are fully developed and  functioning.  Bones harden, and taste buds develop. The fetus may hiccup.  Certain areas of the brain are still developing. The skull remains soft. Ninth month  The fetus gains about  lb (0.23 kg) each week.  The lungs are fully developed.  Patterns of sleep develop.  The fetus's head typically moves into a head-down position (vertex) in the uterus to prepare for birth.  The fetus weighs 6-9 lb (2.72-4.08 kg) and is 19-20 inches (48.26-50.8 cm) long. What can I do to have a healthy pregnancy and help my baby develop? General instructions  Take prenatal vitamins as directed by your health care provider. These include vitamins such as folic acid, iron, calcium, and vitamin D. They are important for healthy development.  Take medicines only as directed by your health care provider. Read labels and ask a pharmacist or your health care provider whether over-the-counter medicines, supplements, and prescription drugs are safe to take during pregnancy.  Keep all follow-up visits as directed by your health care provider. This is important. Follow-up visits include prenatal care and screening tests. How do I know if my baby is developing well? At each prenatal visit, your health care provider will do several different tests to check on your health and keep track of your baby's development. These include:  Fundal height and position. ? Your health care provider will measure your growing belly from your pubic bone to the top of the uterus using a tape measure. ? Your health care provider will also feel your belly to determine your baby's position.  Heartbeat. ? An ultrasound in the first trimester can confirm pregnancy and show a heartbeat, depending on how far along you are. ? Your health care provider will check your baby's heart rate at every prenatal visit.  Second trimester ultrasound. ? This ultrasound checks your baby's development. It also may show your baby's gender. What should I  do if I have concerns about my baby's development? Always talk with your health care provider about any concerns that you may have about your pregnancy and your baby. Summary  A pregnancy usually lasts 280 days, or about 40 weeks. Pregnancy is divided into three periods of growth, also called trimesters.  Your health care provider will monitor your baby's growth and development throughout your pregnancy.  Follow your health care provider's recommendations about taking prenatal vitamins and medicines during your pregnancy.  Talk with your health care provider if you have any concerns about your pregnancy or your developing baby. This information is not intended to replace advice given to you by your health care provider. Make sure you discuss any questions you have with your health care provider. Document Released: 04/17/2008 Document Revised: 02/20/2019 Document Reviewed: 09/12/2017 Elsevier Patient Education  2020 [place] of Pregnancy  The first trimester of pregnancy is from week 1 until the end of week 13 (months 1 through 3). During this time, your baby will begin to develop inside you. At 6-8 weeks, the eyes and face are formed, and the heartbeat can be seen on ultrasound. At the end of 12 weeks, all the baby's organs are formed. Prenatal care is all the medical care you receive before the birth of your baby. Make sure you get good prenatal care and follow all of your doctor's instructions. Follow these instructions at home: Medicines  Take over-the-counter and prescription medicines only as told by your doctor. Some medicines are safe and some medicines are not safe during pregnancy.  Take a prenatal vitamin that contains at least 600 micrograms (mcg) of folic acid.  If you have trouble pooping (constipation), take medicine that will make your stool soft (stool softener) if your doctor approves. Eating and drinking   Eat regular, healthy meals.  Your  doctor will tell you the amount of weight gain that is right for you.  Avoid raw meat and uncooked cheese.  If  you feel sick to your stomach (nauseous) or throw up (vomit): ? Eat 4 or 5 small meals a day instead of 3 large meals. ? Try eating a few soda crackers. ? Drink liquids between meals instead of during meals.  To prevent constipation: ? Eat foods that are high in fiber, like fresh fruits and vegetables, whole grains, and beans. ? Drink enough fluids to keep your pee (urine) clear or pale yellow. Activity  Exercise only as told by your doctor. Stop exercising if you have cramps or pain in your lower belly (abdomen) or low back.  Do not exercise if it is too hot, too humid, or if you are in a place of great height (high altitude).  Try to avoid standing for long periods of time. Move your legs often if you must stand in one place for a long time.  Avoid heavy lifting.  Wear low-heeled shoes. Sit and stand up straight.  You can have sex unless your doctor tells you not to. Relieving pain and discomfort  Wear a good support bra if your breasts are sore.  Take warm water baths (sitz baths) to soothe pain or discomfort caused by hemorrhoids. Use hemorrhoid cream if your doctor says it is okay.  Rest with your legs raised if you have leg cramps or low back pain.  If you have puffy, bulging veins (varicose veins) in your legs: ? Wear support hose or compression stockings as told by your doctor. ? Raise (elevate) your feet for 15 minutes, 3-4 times a day. ? Limit salt in your food. Prenatal care  Schedule your prenatal visits by the twelfth week of pregnancy.  Write down your questions. Take them to your prenatal visits.  Keep all your prenatal visits as told by your doctor. This is important. Safety  Wear your seat belt at all times when driving.  Make a list of emergency phone numbers. The list should include numbers for family, friends, the hospital, and police and  fire departments. General instructions  Ask your doctor for a referral to a local prenatal class. Begin classes no later than at the start of month 6 of your pregnancy.  Ask for help if you need counseling or if you need help with nutrition. Your doctor can give you advice or tell you where to go for help.  Do not use hot tubs, steam rooms, or saunas.  Do not douche or use tampons or scented sanitary pads.  Do not cross your legs for long periods of time.  Avoid all herbs and alcohol. Avoid drugs that are not approved by your doctor.  Do not use any tobacco products, including cigarettes, chewing tobacco, and electronic cigarettes. If you need help quitting, ask your doctor. You may get counseling or other support to help you quit.  Avoid cat litter boxes and soil used by cats. These carry germs that can cause birth defects in the baby and can cause a loss of your baby (miscarriage) or stillbirth.  Visit your dentist. At home, brush your teeth with a soft toothbrush. Be gentle when you floss. Contact a doctor if:  You are dizzy.  You have mild cramps or pressure in your lower belly.  You have a nagging pain in your belly area.  You continue to feel sick to your stomach, you throw up, or you have watery poop (diarrhea).  You have a bad smelling fluid coming from your vagina.  You have pain when you pee (urinate).  You have increased  puffiness (swelling) in your face, hands, legs, or ankles. Get help right away if:  You have a fever.  You are leaking fluid from your vagina.  You have spotting or bleeding from your vagina.  You have very bad belly cramping or pain.  You gain or lose weight rapidly.  You throw up blood. It may look like coffee grounds.  You are around people who have [place] measles, fifth disease, or chickenpox.  You have a very bad headache.  You have shortness of breath.  You have any kind of trauma, such as from a fall or a car  accident. Summary  The first trimester of pregnancy is from week 1 until the end of week 13 (months 1 through 3).  To take care of yourself and your unborn baby, you will need to eat healthy meals, take medicines only if your doctor tells you to do so, and do activities that are safe for you and your baby.  Keep all follow-up visits as told by your doctor. This is important as your doctor will have to ensure that your baby is healthy and growing well. This information is not intended to replace advice given to you by your health care provider. Make sure you discuss any questions you have with your health care provider. Document Released: 04/17/2008 Document Revised: 02/20/2019 Document Reviewed: 11/07/2016 Elsevier Patient Education  2020 [employer]. Commonly Asked Questions During Pregnancy  Cats: A parasite can be excreted in cat feces.  To avoid exposure you need to have another person empty the little box.  If you must empty the litter box you will need to wear gloves.  Wash your hands after handling your cat.  This parasite can also be found in raw or undercooked meat so this should also be avoided.  Colds, Sore Throats, Flu: Please check your medication sheet to see what you can take for symptoms.  If your symptoms are unrelieved by these medications please call the office.  Dental Work: Most any dental work [employer] recommends is permitted.  X-rays should only be taken during the first trimester if absolutely necessary.  Your abdomen should be shielded with a lead apron during all x-rays.  Please notify your provider prior to receiving any x-rays.  Novocaine is fine; gas is not recommended.  If your dentist requires a note from [place] prior to dental work please call the office and we will provide one for you.  Exercise: Exercise is an important part of staying healthy during your pregnancy.  You may continue most exercises you were accustomed to prior to pregnancy.  Later in your  pregnancy you will most likely notice you have difficulty with activities requiring balance like riding a bicycle.  It is important that you listen to your body and avoid activities that put you at a higher risk of falling.  Adequate rest and staying well hydrated are a must!  If you have questions about the safety of specific activities ask your provider.    Exposure to Children with illness: Try to avoid obvious exposure; report any symptoms to [place] when noted,  If you have chicken pos, red measles or mumps, you should be immune to these diseases.   Please do not take any vaccines while pregnant unless you have checked with your OB provider.  Fetal Movement: After 28 weeks we recommend you do "kick counts" twice daily.  Lie or sit down in a calm quiet environment and count your baby movements "kicks".  You  should feel your baby at least 10 times per hour.  If you have not felt 10 kicks within the first hour get up, walk around and have something sweet to eat or drink then repeat for an additional hour.  If count remains less than 10 per hour notify your provider.  Fumigating: Follow your pest control agent's advice as to how long to stay out of your home.  Ventilate the area well before re-entering.  Hemorrhoids:   Most over-the-counter preparations can be used during pregnancy.  Check your medication to see what is safe to use.  It is important to use a stool softener or fiber in your diet and to drink lots of liquids.  If hemorrhoids seem to be getting worse please call the office.   Hot Tubs:  Hot tubs Jacuzzis and saunas are not recommended while pregnant.  These increase your internal body temperature and should be avoided.  Intercourse:  Sexual intercourse is safe during pregnancy as long as you are comfortable, unless otherwise advised by your provider.  Spotting may occur after intercourse; report any bright red bleeding that is heavier than spotting.  Labor:  If you know that you are in labor,  please go to the hospital.  If you are unsure, please call the office and let us help you decide what to do.  Lifting, straining, etc:  If your job requires heavy lifting or straining please check with your provider for any limitations.  Generally, you should not lift items heavier than that you can lift simply with your hands and arms (no back muscles)  Painting:  Paint fumes do not harm your pregnancy, but may make you ill and should be avoided if possible.  Latex or water based paints have less odor than oils.  Use adequate ventilation while painting.  Permanents & Hair Color:  Chemicals in hair dyes are not recommended as they cause increase hair dryness which can increase hair loss during pregnancy.  " Highlighting" and permanents are allowed.  Dye may be absorbed differently and permanents may not hold as well during pregnancy.  Sunbathing:  Use a sunscreen, as skin burns easily during pregnancy.  Drink plenty of fluids; avoid over heating.  Tanning Beds:  Because their possible side effects are still unknown, tanning beds are not recommended.  Ultrasound Scans:  Routine ultrasounds are performed at approximately 20 weeks.  You will be able to see your baby's general anatomy an if you would like to know the gender this can usually be determined as well.  If it is questionable when you conceived you may also receive an ultrasound early in your pregnancy for dating purposes.  Otherwise ultrasound exams are not routinely performed unless there is a medical necessity.  Although you can request a scan we ask that you pay for it when conducted because insurance does not cover " patient request" scans.  Work: If your pregnancy proceeds without complications you may work until your due date, unless your physician or employer advises otherwise.  Round Ligament Pain/Pelvic Discomfort:  Sharp, shooting pains not associated with bleeding are fairly common, usually occurring in the second trimester of  pregnancy.  They tend to be worse when standing up or when you remain standing for long periods of time.  These are the result of pressure of certain pelvic ligaments called "round ligaments".  Rest, Tylenol and heat seem to be the most effective relief.  As the womb and fetus grow, they rise out of the pelvis  and the discomfort improves.  Please notify the office if your pain seems different than that described.  It may represent a more serious condition.  Common Medications Safe in Pregnancy  Acne:      Constipation:  Benzoyl Peroxide     Colace  Clindamycin      Dulcolax Suppository  Topica Erythromycin     Fibercon  Salicylic Acid      Metamucil         Miralax AVOID:        Senakot   Accutane    Cough:  Retin-A       Cough Drops  Tetracycline      Phenergan w/ Codeine if Rx  Minocycline      Robitussin (Plain & DM)  Antibiotics:     Crabs/Lice:  Ceclor       RID  Cephalosporins    AVOID:  E-Mycins      Kwell  Keflex  Macrobid/Macrodantin   Diarrhea:  Penicillin      Kao-Pectate  Zithromax      Imodium AD         PUSH FLUIDS AVOID:       Cipro     Fever:  Tetracycline      Tylenol (Regular or Extra  Minocycline       Strength)  Levaquin      Extra Strength-Do not          Exceed 8 tabs/24 hrs Caffeine:        <258m/day (equiv. To 1 cup of coffee or  approx. 3 12 oz sodas)         Gas: Cold/Hayfever:       Gas-X  Benadryl      Mylicon  Claritin       Phazyme  **Claritin-D        Chlor-Trimeton    Headaches:  Dimetapp      ASA-Free Excedrin  Drixoral-Non-Drowsy     Cold Compress  Mucinex (Guaifenasin)     Tylenol (Regular or Extra  Sudafed/Sudafed-12 Hour     Strength)  **Sudafed PE Pseudoephedrine   Tylenol Cold & Sinus     Vicks Vapor Rub  Zyrtec  **AVOID if Problems With Blood Pressure         Heartburn: Avoid lying down for at least 1 hour after meals  Aciphex      Maalox     Rash:  Milk of Magnesia     Benadryl    Mylanta       1% Hydrocortisone  Cream  Pepcid  Pepcid Complete   Sleep Aids:  Prevacid      Ambien   Prilosec       Benadryl  Rolaids       Chamomile Tea  Tums (Limit 4/day)     Unisom  Zantac       Tylenol PM         Warm milk-add vanilla or  Hemorrhoids:       Sugar for taste  Anusol/Anusol H.C.  (RX: Analapram 2.5%)  Sugar Substitutes:  Hydrocortisone OTC     Ok in moderation  Preparation H      Tucks        Vaseline lotion applied to tissue with wiping    Herpes:     Throat:  Acyclovir      Oragel  Famvir  Valtrex     Vaccines:         Flu Shot Leg Cramps:       *  Pneumovax  Saline Nasal Spray     Polio Booster         Tetanus Nausea:       Tuberculosis test or PPD  Vitamin B6 25 mg TID   AVOID:    Dramamine      *Gardasil  Emetrol       Live Poliovirus  Ginger Root 250 mg QID    MMR (measles, mumps &  High Complex Carbs @ Bedtime    rebella)  Sea Bands-Accupressure    Varicella (Chickenpox)  Unisom 1/2 tab TID     *No known complications           If received before Pain:         Known pregnancy;   Darvocet       Resume series after  Lortab        Delivery  Percocet    Yeast:   Tramadol      Femstat  Tylenol 3      Gyne-lotrimin  Ultram       Monistat  Vicodin           MISC:         All Sunscreens           Hair Coloring/highlights          Insect Repellant's          (Including DEET)         Mystic Tans  

## 2019-09-29 NOTE — Progress Notes (Signed)
      Karen Palmer presents for NOB nurse intake visit. Pregnancy confirmation done at [facility], 09/17/19, with Felicity Coyer.  G 3.  P 1011 .  LMP 07/20/19. EDD 04/29/2020.  Ga [redacted]w[redacted]d. Pregnancy education material explained and given.  0 cats in the home.  NOB labs ordered. BMI less than 30. TSH/HbgA1c not ordered. Sickle cell order due to race. HIV and drug screen explained and ordered. Genetic screening discussed. Genetic testing; Unsure. Pt to discuss genetic testing with provider. PNV encouraged. Pt to follow up with provider in 3 weeks for NOB physical. [place] reviewed with pt. FMLA form signed and reviewed with pt.  BP 128/81   Pulse 84   Ht 5\' 1"  (1.549 m)   Wt 157 lb 8 oz (71.4 kg)   LMP 07/20/2019   BMI 29.76 kg/m

## 2019-09-30 LAB — NICOTINE SCREEN, URINE: Cotinine Ql Scrn, Ur: NEGATIVE ng/mL

## 2019-09-30 LAB — TOXOPLASMA ANTIBODIES- IGG AND  IGM
Toxoplasma Antibody- IgM: <3 AU/mL (ref 0.0–7.9)
Toxoplasma IgG Ratio: <3 IU/mL (ref 0.0–7.1)

## 2019-09-30 LAB — HGB SOLU + RFLX FRAC: Sickle Solubility Test - HGBRFX: NEGATIVE

## 2019-09-30 LAB — URINALYSIS, ROUTINE W REFLEX MICROSCOPIC
Bilirubin, UA: NEGATIVE
Glucose, UA: NEGATIVE
Ketones, UA: NEGATIVE
Leukocytes,UA: NEGATIVE
Nitrite, UA: NEGATIVE
Protein,UA: NEGATIVE
RBC, UA: NEGATIVE
Specific Gravity, UA: 1.023 (ref 1.005–1.030)
Urobilinogen, Ur: 0.2 mg/dL (ref 0.2–1.0)
pH, UA: 7 (ref 5.0–7.5)

## 2019-09-30 LAB — ABO AND RH: Rh Factor: NEGATIVE

## 2019-09-30 LAB — DRUG PROFILE, UR, 9 DRUGS (LABCORP)
Amphetamines, Urine: NEGATIVE ng/mL
Barbiturate Quant, Ur: NEGATIVE ng/mL
Benzodiazepine Quant, Ur: NEGATIVE ng/mL
Cannabinoid Quant, Ur: NEGATIVE ng/mL
Cocaine (Metab.): NEGATIVE ng/mL
Methadone Screen, Urine: NEGATIVE ng/mL
Opiate Quant, Ur: NEGATIVE ng/mL
PCP Quant, Ur: NEGATIVE ng/mL
Propoxyphene: NEGATIVE ng/mL

## 2019-09-30 LAB — RUBELLA SCREEN: Rubella Antibodies, IGG: 5.43 index (ref >0.99)

## 2019-09-30 LAB — HIV ANTIBODY (ROUTINE TESTING W REFLEX): HIV Screen 4th Generation wRfx: NONREACTIVE

## 2019-09-30 LAB — RPR: RPR Ser Ql: NONREACTIVE

## 2019-09-30 LAB — HEPATITIS B SURFACE ANTIGEN: Hepatitis B Surface Ag: NEGATIVE

## 2019-09-30 LAB — ANTIBODY SCREEN: Antibody Screen: NEGATIVE

## 2019-09-30 LAB — VARICELLA ZOSTER ANTIBODY, IGG: Varicella zoster IgG: 1785 index (ref >165)

## 2019-10-01 LAB — CULTURE, OB URINE

## 2019-10-01 LAB — URINE CULTURE, OB REFLEX

## 2019-10-03 LAB — GC/CHLAMYDIA PROBE AMP
Chlamydia trachomatis, NAA: NEGATIVE
Neisseria Gonorrhoeae by PCR: NEGATIVE

## 2019-10-14 ENCOUNTER — Encounter: Payer: Commercial Managed Care - PPO | Admitting: Obstetrics and Gynecology

## 2019-10-22 ENCOUNTER — Other Ambulatory Visit: Payer: Self-pay

## 2019-10-22 ENCOUNTER — Other Ambulatory Visit (HOSPITAL_COMMUNITY)
Admission: RE | Admit: 2019-10-22 | Discharge: 2019-10-22 | Disposition: A | Discharge status: Home / Self Care | Payer: PRIVATE HEALTH INSURANCE | Source: Ambulatory Visit | Attending: Obstetrics and Gynecology | Admitting: Obstetrics and Gynecology

## 2019-10-22 ENCOUNTER — Ambulatory Visit (INDEPENDENT_AMBULATORY_CARE_PROVIDER_SITE_OTHER): Payer: Commercial Managed Care - PPO | Admitting: Obstetrics and Gynecology

## 2019-10-22 ENCOUNTER — Encounter: Payer: Self-pay | Admitting: Obstetrics and Gynecology

## 2019-10-22 VITALS — BP 144/85 | HR 76 | Wt 163.6 lb

## 2019-10-22 DIAGNOSIS — Z124 Encounter for screening for malignant neoplasm of cervix: Secondary | ICD-10-CM

## 2019-10-22 DIAGNOSIS — Z3481 Encounter for supervision of other normal pregnancy, first trimester: Secondary | ICD-10-CM

## 2019-10-22 DIAGNOSIS — Z3A15 15 weeks gestation of pregnancy: Secondary | ICD-10-CM

## 2019-10-22 LAB — POCT URINALYSIS DIPSTICK OB
Bilirubin, UA: NEGATIVE
Blood, UA: NEGATIVE
Glucose, UA: NEGATIVE
Ketones, UA: NEGATIVE
Leukocytes, UA: NEGATIVE
Nitrite, UA: NEGATIVE
POC,PROTEIN,UA: NEGATIVE
Spec Grav, UA: 1.01 (ref 1.010–1.025)
Urobilinogen, UA: 0.2 E.U./dL
pH, UA: 7 (ref 5.0–8.0)

## 2019-10-22 NOTE — Progress Notes (Signed)
NOB: No complaints.  Nausea and vomiting have resolved.  Patient desires maternity 48.  Considering AFP for spina bifida at next visit.  Patient desires gender reveal later in pregnancy.  Begin aspirin daily for history of SGA infant at term.  Physical examination General NAD, Conversant  HEENT Atraumatic; Op clear with mmm.  Normo-cephalic. Pupils reactive. Anicteric sclerae  Thyroid/Neck Smooth without nodularity or enlargement. Normal ROM.  Neck Supple.  Skin No rashes, lesions or ulceration. Normal palpated skin turgor. No nodularity.  Breasts: No masses or discharge.  Symmetric.  No axillary adenopathy.  Bilateral piercing  Lungs: Clear to auscultation.No rales or wheezes. Normal Respiratory effort, no retractions.  Heart: NSR.  No murmurs or rubs appreciated. No periferal edema  Abdomen: Soft.  Non-tender.  No masses.  No HSM. No hernia  Extremities: Moves all appropriately.  Normal ROM for age. No lymphadenopathy.  Neuro: Oriented to PPT.  Normal mood. Normal affect.     Pelvic:   Vulva: Normal appearance.  No lesions.  Vagina: No lesions or abnormalities noted.  Support: Normal pelvic support.  Urethra No masses tenderness or scarring.  Meatus Normal size without lesions or prolapse.  Cervix: Normal appearance.  No lesions.  Anus: Normal exam.  No lesions.  Perineum: Normal exam.  No lesions.        Bimanual   Adnexae: No masses.  Non-tender to palpation.  Uterus: Enlarged. 15 wks  Pos Fht's  Non-tender.  Mobile.  AV.  Adnexae: No masses.  Non-tender to palpation.  Cul-de-sac: Negative for abnormality.  Adnexae: No masses.  Non-tender to palpation.         Pelvimetry   Diagonal: Reached.  Spines: Average.  Sacrum: Concave.  Pubic Arch: Normal.   Ready set baby discussed -patient breast-fed last infant and plans to breast-feed again The following were addressed during this visit:  Breastfeeding Education - Early initiation of breastfeeding    Comments: Keeps milk  supply adequate, helps contract uterus and slow bleeding, and early milk is the perfect first food and is easy to digest.   - The importance of exclusive breastfeeding    Comments: Provides antibodies, Lower risk of breast and ovarian cancers, and type-2 diabetes,Helps your body recover, Reduced chance of SIDS.   - Risks of giving your baby anything other than breast milk if you are breastfeeding    Comments: Make the baby less content with breastfeeds, may make my baby more susceptible to illness, and may reduce my milk supply.   - Exclusive breastfeeding for the first 6 months    Comments: Builds a healthy milk supply and keeps it up, protects baby from sickness and disease, and breastmilk has everything your baby needs for the first 6 months.  - Individualized Education    Comments: Contraindications to breastfeeding and other special medical conditions

## 2019-10-22 NOTE — Addendum Note (Signed)
Addended by: Durwin Glaze on: 10/22/2019 11:48 AM   Modules accepted: Orders

## 2019-10-22 NOTE — Addendum Note (Signed)
Addended by: Durwin Glaze on: 10/22/2019 01:34 PM   Modules accepted: Orders

## 2019-10-22 NOTE — Progress Notes (Signed)
Patient comes in today for new OB physical. Patient stated that [facility]. She would like to have genetic testing.

## 2019-10-23 LAB — CBC WITH DIFFERENTIAL/PLATELET
Basophils Absolute: 0.1 10*3/uL (ref 0.0–0.2)
Basos: 0 %
EOS (ABSOLUTE): 0.3 10*3/uL (ref 0.0–0.4)
Eos: 2 %
Hematocrit: 30.5 % — ABNORMAL LOW (ref 34.0–46.6)
Hemoglobin: 9.9 g/dL — ABNORMAL LOW (ref 11.1–15.9)
Immature Grans (Abs): 0 10*3/uL (ref 0.0–0.1)
Immature Granulocytes: 0 %
Lymphocytes Absolute: 2.3 10*3/uL (ref 0.7–3.1)
Lymphs: 20 %
MCH: 25.4 pg — ABNORMAL LOW (ref 26.6–33.0)
MCHC: 32.5 g/dL (ref 31.5–35.7)
MCV: 78 fL — ABNORMAL LOW (ref 79–97)
Monocytes Absolute: 1 10*3/uL — ABNORMAL HIGH (ref 0.1–0.9)
Monocytes: 9 %
Neutrophils Absolute: 7.7 10*3/uL — ABNORMAL HIGH (ref 1.4–7.0)
Neutrophils: 69 %
Platelets: 271 10*3/uL (ref 150–450)
RBC: 3.9 x10E6/uL (ref 3.77–5.28)
RDW: 15.1 % (ref 11.7–15.4)
WBC: 11.3 10*3/uL — ABNORMAL HIGH (ref 3.4–10.8)

## 2019-10-27 LAB — CYTOLOGY - PAP: Diagnosis: NEGATIVE

## 2019-10-28 LAB — MATERNIT21  PLUS CORE+ESS+SCA, BLOOD

## 2019-11-14 NOTE — L&D Delivery Note (Signed)
Delivery Summary for [facility]  Labor Events:   Preterm labor: No data found  Rupture date: No data found  Rupture time: No data found  Rupture type: No data found  Fluid Color: No data found  Induction: No data found  Augmentation: No data found  Complications: No data found  Cervical ripening: No data found No data found   No data found     Delivery:   Episiotomy: No data found  Lacerations: No data found  Repair suture: No data found  Repair # of packets: No data found  Blood loss (ml): 50   Information for the patient's newborn:  Baleria, Wyman [[id number]]    Delivery 04/25/2020 12:44 AM by  Vaginal, Vacuum (Extractor) Sex:  female Gestational Age: [redacted]w[redacted]d Delivery Clinician:   Living?:         APGARS  One minute Five minutes Ten minutes  Skin color:        Heart rate:        Grimace:        Muscle tone:        Breathing:        Totals: 6  8      Presentation/position:      Resuscitation:   Cord information:    Disposition of cord blood:     Blood gases sent?  Complications:   Placenta: Delivered:       appearance Newborn Measurements: Weight: 7 lb 2.3 oz (3240 g)  Height: 19.69"  Head circumference:    Chest circumference:    Other providers:    Additional  information: Forceps:   Vacuum:   Breech:   Observed anomalies       Operative Delivery Note At 12:44 AM a viable and healthy female was delivered via Vaginal, Vacuum Neurosurgeon).  Presentation: vertex, LOA position (asynclitic); Station: +2.  Verbal consent: obtained from patient.  Risks and benefits discussed in detail.  Risks include, but are not limited to the risks of anesthesia, bleeding, infection, damage to maternal tissues, fetal cephalhematoma.  There is also the risk of inability to effect vaginal delivery of the head, or shoulder dystocia that cannot be resolved by established maneuvers, leading to the need for emergency cesarean section.  With the cervix 10 cm dilated, 100  effaced, +2 station the Kiwi bell vacuum was appliedand after good position of the vacuum noted, gentle traction was applied during uterine contraction and maternal pushing resulting in good decent of the fetal head.   0 pop-offs occurred.  The fetal head was at +4 station when the vacuum was removed, after 2 contractions. And with continued good maternal effort and pitocin-assisted, she had vaginal delivery of a 3240 gram female infant with Apgars 6 and 8 at 1 and 5 minutes, respectively over an intact perineum.  Fetal head was in LOA position, a nuchal cord was not encountered.  The remainder of the infant was delivered without difficulty.  The cord was clamped and cut  and the infant was handed to the awaiting Neonatal team. Delayed cord clamping not observed due to fetal status at delivery.  Cord blood and cord gas were obtained.  Placenta delivered spontaneously and intact with 3VC.  No lacerations noted.  Superficial introitus abrasions were noted not needing repair.    Anesthesia:  Epidural  Episiotomy: None Lacerations: None Suture Repair: None Est. Blood Loss (mL): 50  Mom to postpartum.  Baby to Couplet care / Skin to Skin.  Rubie Maid, MD [facility] 04/25/2020, 1:11 AM

## 2019-11-19 NOTE — Progress Notes (Signed)
ROB-Pt present for routine prenatal care. Pt stated noticing heart palpitations off and on throughout the day.

## 2019-11-20 ENCOUNTER — Other Ambulatory Visit: Payer: Self-pay

## 2019-11-20 ENCOUNTER — Ambulatory Visit (INDEPENDENT_AMBULATORY_CARE_PROVIDER_SITE_OTHER): Payer: Commercial Managed Care - PPO | Admitting: Obstetrics and Gynecology

## 2019-11-20 ENCOUNTER — Encounter: Payer: Self-pay | Admitting: Obstetrics and Gynecology

## 2019-11-20 VITALS — BP 110/68 | HR 79 | Wt 172.1 lb

## 2019-11-20 DIAGNOSIS — Z1379 Encounter for other screening for genetic and chromosomal anomalies: Secondary | ICD-10-CM

## 2019-11-20 DIAGNOSIS — Z3482 Encounter for supervision of other normal pregnancy, second trimester: Secondary | ICD-10-CM

## 2019-11-20 DIAGNOSIS — Z3A17 17 weeks gestation of pregnancy: Secondary | ICD-10-CM

## 2019-11-20 DIAGNOSIS — R002 Palpitations: Secondary | ICD-10-CM

## 2019-11-20 DIAGNOSIS — O99891 Other specified diseases and conditions complicating pregnancy: Secondary | ICD-10-CM

## 2019-11-20 LAB — POCT URINALYSIS DIPSTICK OB
Bilirubin, UA: NEGATIVE
Glucose, UA: NEGATIVE
Ketones, UA: NEGATIVE
Nitrite, UA: NEGATIVE
POC,PROTEIN,UA: NEGATIVE
Spec Grav, UA: 1.015 (ref 1.010–1.025)
Urobilinogen, UA: 0.2 E.U./dL
pH, UA: 7 (ref 5.0–8.0)

## 2019-11-20 NOTE — Progress Notes (Signed)
ROB: patient complains of heart palpitations.  Notes that these are occurring just about daily over the past 2 months. Sometimes she feels that they cause her to feel short of breath. Notes that she does have a history of anxiety with panic attacks. Has never told anyone but her mother.  Last panic attack was yesterday (after engaging in an argument with FOB). Notes that prior to this she has not had a panic attack in over 1-2 years.  Reports that the feelings she has had over the past 1-2 months is different from her panic attacks. Will have patient f/u with Cardiology as symptoms have occurred shortly after onset of pregnancy. Also ordered TSH to r/o endocrine cause. For AFP today. RTC in 4 weeks, for anatomy scan at this time.

## 2019-11-22 LAB — AFP, SERUM, OPEN SPINA BIFIDA
AFP MoM: 0.69
AFP Value: 27.3 ng/mL
Gest. Age on Collection Date: 17.2 weeks
Maternal Age At EDD: 28.3 yr
OSBR Risk 1 IN: 10000
Test Results:: NEGATIVE
Weight: 172 [lb_av]

## 2019-11-22 LAB — TSH: TSH: 2.4 u[IU]/mL (ref 0.450–4.500)

## 2019-12-03 ENCOUNTER — Encounter: Payer: Self-pay | Admitting: Surgical

## 2019-12-03 ENCOUNTER — Telehealth: Payer: Self-pay

## 2019-12-03 NOTE — Telephone Encounter (Signed)
Patient needs GYN/OB authorization to have a tooth removed. Please call patient

## 2019-12-03 NOTE — Telephone Encounter (Signed)
Please send patient dental letter.

## 2019-12-03 NOTE — Telephone Encounter (Signed)
Notified patient that letter has been sent to her my chart. She is going to print it off.

## 2019-12-03 NOTE — Telephone Encounter (Signed)
Completed printed and sent to pt via mychart.

## 2019-12-18 ENCOUNTER — Ambulatory Visit (INDEPENDENT_AMBULATORY_CARE_PROVIDER_SITE_OTHER): Payer: Commercial Managed Care - PPO

## 2019-12-18 ENCOUNTER — Encounter: Payer: Self-pay | Admitting: Obstetrics and Gynecology

## 2019-12-18 ENCOUNTER — Other Ambulatory Visit: Payer: Self-pay

## 2019-12-18 ENCOUNTER — Ambulatory Visit (INDEPENDENT_AMBULATORY_CARE_PROVIDER_SITE_OTHER): Payer: Commercial Managed Care - PPO | Admitting: Obstetrics and Gynecology

## 2019-12-18 VITALS — BP 133/82 | HR 85 | Wt 178.0 lb

## 2019-12-18 DIAGNOSIS — Z3482 Encounter for supervision of other normal pregnancy, second trimester: Secondary | ICD-10-CM | POA: Diagnosis not present

## 2019-12-18 NOTE — Progress Notes (Signed)
[facility] student met with patient and discussed Ready, Set, Baby and basics in breastfeeding.

## 2019-12-18 NOTE — Progress Notes (Signed)
Patient comes in today for ROB visit. She has no concerns today.  

## 2019-12-18 NOTE — Progress Notes (Signed)
ROB: Patient had FAS today.  No problems.  Reports daily fetal movement.  Previous concern regarding recurring episodes of tachycardia has declined dramatically in frequency.  She states it has only happened 1 time in the last month.  We have discussed this in some detail and no further work-up necessary unless it begins to happen more frequently again.  Patient remains asymptomatic during its occurrence.  Consider cardiology referral for Holter monitor if frequency again increases.

## 2019-12-23 ENCOUNTER — Encounter: Payer: Commercial Managed Care - PPO | Admitting: Obstetrics and Gynecology

## 2019-12-23 ENCOUNTER — Telehealth: Payer: Self-pay

## 2019-12-23 NOTE — Telephone Encounter (Signed)
Mother actually called with concerns for her daughter and baby

## 2019-12-23 NOTE — Telephone Encounter (Signed)
Please see mychart message.

## 2019-12-23 NOTE — Telephone Encounter (Signed)
Pt was returning your call. Pt is requesting a call back. Please advise

## 2019-12-23 NOTE — Telephone Encounter (Signed)
Patient has not felt her baby move in 12 hours. Please contact patient for nurse advice.  -Ardelle Lesches

## 2020-01-15 ENCOUNTER — Other Ambulatory Visit: Payer: Self-pay

## 2020-01-15 ENCOUNTER — Ambulatory Visit (INDEPENDENT_AMBULATORY_CARE_PROVIDER_SITE_OTHER): Payer: Commercial Managed Care - PPO | Admitting: Obstetrics and Gynecology

## 2020-01-15 ENCOUNTER — Encounter: Payer: Self-pay | Admitting: Obstetrics and Gynecology

## 2020-01-15 VITALS — BP 122/79 | HR 84 | Wt 186.0 lb

## 2020-01-15 DIAGNOSIS — Z3483 Encounter for supervision of other normal pregnancy, third trimester: Secondary | ICD-10-CM

## 2020-01-15 DIAGNOSIS — Z3A25 25 weeks gestation of pregnancy: Secondary | ICD-10-CM

## 2020-01-15 DIAGNOSIS — O2602 Excessive weight gain in pregnancy, second trimester: Secondary | ICD-10-CM

## 2020-01-15 DIAGNOSIS — Z131 Encounter for screening for diabetes mellitus: Secondary | ICD-10-CM

## 2020-01-15 LAB — POCT URINALYSIS DIPSTICK OB
Bilirubin, UA: NEGATIVE
Blood, UA: NEGATIVE
Glucose, UA: NEGATIVE
Ketones, UA: NEGATIVE
Nitrite, UA: NEGATIVE
Spec Grav, UA: 1.025 (ref 1.010–1.025)
Urobilinogen, UA: 0.2 E.U./dL
pH, UA: 6 (ref 5.0–8.0)

## 2020-01-15 NOTE — Progress Notes (Signed)
ROB: Patient notes pelvic/vaginal pressure. 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 Desires to breast and bottle feed.  Undecided on contraception. Given handout on options. Discussed doula program.  Plans for medicated birth (likely epidural). Discussed excessive weight gain in pregnancy, referred to a nutritionist, discussed weight plan. RTC in 3 weeks.

## 2020-01-15 NOTE — Progress Notes (Signed)
ROB-Pt present for annual exam. Pt c/o lower abd/vaginal pressure. Pt denies any other issues at this time.

## 2020-01-26 ENCOUNTER — Telehealth: Payer: Self-pay | Admitting: Obstetrics and Gynecology

## 2020-01-26 NOTE — Telephone Encounter (Signed)
Pt called in and stated that she needs to start her fmla. I told her to let her work know they will fax [place] over the paperwork.

## 2020-02-04 ENCOUNTER — Encounter: Payer: Commercial Managed Care - PPO | Admitting: Obstetrics and Gynecology

## 2020-02-04 ENCOUNTER — Other Ambulatory Visit: Payer: Commercial Managed Care - PPO

## 2020-02-10 ENCOUNTER — Encounter: Payer: Self-pay | Admitting: Obstetrics and Gynecology

## 2020-02-10 ENCOUNTER — Other Ambulatory Visit: Payer: Self-pay

## 2020-02-10 ENCOUNTER — Other Ambulatory Visit: Payer: Commercial Managed Care - PPO

## 2020-02-10 ENCOUNTER — Ambulatory Visit (INDEPENDENT_AMBULATORY_CARE_PROVIDER_SITE_OTHER): Payer: Commercial Managed Care - PPO | Admitting: Obstetrics and Gynecology

## 2020-02-10 VITALS — BP 134/86 | HR 84 | Wt 191.9 lb

## 2020-02-10 DIAGNOSIS — Z23 Encounter for immunization: Secondary | ICD-10-CM

## 2020-02-10 DIAGNOSIS — Z3483 Encounter for supervision of other normal pregnancy, third trimester: Secondary | ICD-10-CM

## 2020-02-10 DIAGNOSIS — Z131 Encounter for screening for diabetes mellitus: Secondary | ICD-10-CM

## 2020-02-10 DIAGNOSIS — Z8759 Personal history of other complications of pregnancy, childbirth and the puerperium: Secondary | ICD-10-CM

## 2020-02-10 DIAGNOSIS — Z3A29 29 weeks gestation of pregnancy: Secondary | ICD-10-CM

## 2020-02-10 LAB — POCT URINALYSIS DIPSTICK OB
Bilirubin, UA: NEGATIVE
Blood, UA: NEGATIVE
Glucose, UA: NEGATIVE
Ketones, UA: NEGATIVE
Leukocytes, UA: NEGATIVE
Nitrite, UA: NEGATIVE
Spec Grav, UA: 1.01 (ref 1.010–1.025)
Urobilinogen, UA: 0.2 E.U./dL
pH, UA: 7 (ref 5.0–8.0)

## 2020-02-10 NOTE — Progress Notes (Signed)
ROB: No complaints today.  Did not go to nutritionist because she was afraid of the cost but she says she is determined to go this week.  Continued weight gain discussed.  History of SGA baby-currently size greater than dates -ultrasound ordered for growth.  1 hour GCT today.  Patient not taking prenatal vitamins or aspirin as directed.  Says that she never started aspirin.  Strongly advised her to restart prenatal vitamins.

## 2020-02-11 ENCOUNTER — Other Ambulatory Visit: Payer: Self-pay | Admitting: Obstetrics and Gynecology

## 2020-02-11 LAB — GLUCOSE, 1 HOUR GESTATIONAL: Gestational Diabetes Screen: 80 mg/dL (ref 65–139)

## 2020-02-11 LAB — RPR: RPR Ser Ql: NONREACTIVE

## 2020-02-11 LAB — CBC
Hematocrit: 29.7 % — ABNORMAL LOW (ref 34.0–46.6)
Hemoglobin: 9.4 g/dL — ABNORMAL LOW (ref 11.1–15.9)
MCH: 24.9 pg — ABNORMAL LOW (ref 26.6–33.0)
MCHC: 31.6 g/dL (ref 31.5–35.7)
MCV: 79 fL (ref 79–97)
Platelets: 233 10*3/uL (ref 150–450)
RBC: 3.77 x10E6/uL (ref 3.77–5.28)
RDW: 13.6 % (ref 11.7–15.4)
WBC: 10.8 10*3/uL (ref 3.4–10.8)

## 2020-02-11 MED ORDER — FERROUS SULFATE 325 (65 FE) MG PO TABS: 325.0000 mg | ORAL_TABLET | Freq: Every day | ORAL | 1 refills | Status: AC

## 2020-02-16 ENCOUNTER — Telehealth: Payer: Self-pay | Admitting: Obstetrics and Gynecology

## 2020-02-16 NOTE — Telephone Encounter (Signed)
Pt called in and stated that her work is no longer going to compensate her. The pt was told by her work to call her ob 1st. I told the pt that we have a fmla process.  They have to fax [place] your paperworl The pt was requesting a call back. Please advise

## 2020-02-18 DIAGNOSIS — Z0289 Encounter for other administrative examinations: Secondary | ICD-10-CM

## 2020-02-18 NOTE — Telephone Encounter (Signed)
Yes tab said we got them and she will call the pt to get the fee. And we will put them in jamie's box following our new fmla process.

## 2020-03-02 ENCOUNTER — Other Ambulatory Visit: Payer: Commercial Managed Care - PPO

## 2020-03-02 ENCOUNTER — Other Ambulatory Visit: Payer: Self-pay

## 2020-03-02 ENCOUNTER — Ambulatory Visit (INDEPENDENT_AMBULATORY_CARE_PROVIDER_SITE_OTHER): Payer: Commercial Managed Care - PPO | Admitting: Obstetrics and Gynecology

## 2020-03-02 ENCOUNTER — Encounter: Payer: Commercial Managed Care - PPO | Admitting: Obstetrics and Gynecology

## 2020-03-02 ENCOUNTER — Encounter: Payer: Self-pay | Admitting: Obstetrics and Gynecology

## 2020-03-02 ENCOUNTER — Ambulatory Visit (INDEPENDENT_AMBULATORY_CARE_PROVIDER_SITE_OTHER): Payer: Commercial Managed Care - PPO

## 2020-03-02 VITALS — BP 118/80 | HR 101 | Wt 191.3 lb

## 2020-03-02 DIAGNOSIS — Z2913 Encounter for prophylactic Rho(D) immune globulin: Secondary | ICD-10-CM

## 2020-03-02 DIAGNOSIS — Z8759 Personal history of other complications of pregnancy, childbirth and the puerperium: Secondary | ICD-10-CM | POA: Diagnosis not present

## 2020-03-02 DIAGNOSIS — Z3483 Encounter for supervision of other normal pregnancy, third trimester: Secondary | ICD-10-CM

## 2020-03-02 DIAGNOSIS — O26893 Other specified pregnancy related conditions, third trimester: Secondary | ICD-10-CM

## 2020-03-02 DIAGNOSIS — R102 Pelvic and perineal pain: Secondary | ICD-10-CM

## 2020-03-02 DIAGNOSIS — Z3A32 32 weeks gestation of pregnancy: Secondary | ICD-10-CM | POA: Diagnosis not present

## 2020-03-02 LAB — POCT URINALYSIS DIPSTICK OB
Bilirubin, UA: NEGATIVE
Blood, UA: NEGATIVE
Glucose, UA: NEGATIVE
Ketones, UA: NEGATIVE
Nitrite, UA: NEGATIVE
Spec Grav, UA: >=1.03 — AB (ref 1.010–1.025)
Urobilinogen, UA: 0.2 E.U./dL
pH, UA: 6 (ref 5.0–8.0)

## 2020-03-02 MED ORDER — RHO D IMMUNE GLOBULIN 1500 UNIT/2ML IJ SOSY
300.0000 ug | PREFILLED_SYRINGE | Freq: Once | INTRAMUSCULAR | Status: AC
  Administered 2020-03-02: 300 ug via INTRAMUSCULAR

## 2020-03-02 NOTE — Progress Notes (Signed)
ROB: Notes she used job restriction letter at work, was moved to a different area but this placed more work on her and had more demanding physical duties. Also notes that another employee quit so she had to pick up the extra work. Still has not yet tried a belly band. Notes she has requested FMLA, needs paper filled out. Patient notes she did not go to her nutrition consult for excessive weight gain in pregnacy as she though her insurance would not cover the visit and she was experiencing financial strains at the time. Advised that visit should be covered, encouraged to go. Has been trying to walk more. No weight gain noted since last visit. Rhogam given today.

## 2020-03-02 NOTE — Progress Notes (Signed)
ROB-Pt present for routine prenatal care. Pt c/o of abd cramping all day yesterday. Pt had her Rhogam vaccine given today.

## 2020-03-02 NOTE — Patient Instructions (Signed)
Healthy Weight Gain During Pregnancy, Adult A certain amount of weight gain during pregnancy is normal and healthy. How much weight you should gain depends on your overall health and a measurement called BMI (body mass index). BMI is an estimate of your body fat based on your height and weight. You can use an online calculator to figure out your BMI, or you can ask your health care provider to calculate it for you at your next visit. Your recommended pregnancy weight gain is based on your pre-pregnancy BMI. General guidelines for a healthy total weight gain during pregnancy are listed below. If your BMI at or before the start of your pregnancy is:  Less than 18.5 (underweight), you should gain 28-40 lb (13-18 kg).  18.5-24.9 (normal weight), you should gain 25-35 lb (11-16 kg).  25-29.9 (overweight), you should gain 15-25 lb (7-11 kg).  30 or higher (obese), you should gain 11-20 lb (5-9 kg). These ranges vary depending on your individual health. If you are carrying more than one baby (multiples), it may be safe to gain more weight than these recommendations. If you gain less weight than recommended, that may be safe as long as your baby is growing and developing normally. How can unhealthy weight gain affect me and my baby? Gaining too much weight during pregnancy can lead to pregnancy complications, such as:  A temporary form of diabetes that develops during pregnancy (gestational diabetes).  High blood pressure during pregnancy and protein in your urine (preeclampsia).  High blood pressure during pregnancy without protein in your urine (gestational hypertension).  Your baby having a high weight at birth, which may: ? Raise your risk of having a more difficult delivery or a surgical delivery (cesarean delivery, or C-section). ? Raise your child's risk of developing obesity during childhood. Not gaining enough weight can be life-threatening for your baby, and it may raise your baby's chances  of:  Being born early (preterm).  Growing more slowly than normal during pregnancy (growth restriction).  Having a low weight at birth. What actions can I take to gain a healthy amount of weight during pregnancy? General instructions  Keep track of your weight gain during pregnancy.  Take over-the-counter and prescription medicines only as told by your health care provider. Take all prenatal supplements as directed.  Keep all health care visits during pregnancy (prenatal visits). These visits are a good time to discuss your weight gain. Your health care provider will weigh you at each visit to make sure you are gaining a healthy amount of weight. Nutrition   Eat a balanced, nutrient-rich diet. Eat plenty of: ? Fruits and vegetables, such as berries and broccoli. ? Whole grains, such as millet, barley, whole-wheat breads and cereals, and oatmeal. ? Low-fat dairy products or non-dairy products such as almond milk or rice milk. ? Protein foods, such as lean meat, chicken, eggs, and legumes (such as peas, beans, soybeans, and lentils).  Avoid foods that are fried or have a lot of fat, salt (sodium), or sugar.  Drink enough fluid to keep your urine pale yellow.  Choose healthy snack and drink options when you are at work or on the go: ? Drink water. Avoid soda, sports drinks, and juices that have added sugar. ? Avoid drinks with caffeine, such as coffee and energy drinks. ? Eat snacks that are high in protein, such as nuts, protein bars, and low-fat yogurt. ? Carry convenient snacks in your purse that do not need refrigeration, such as a pack of   trail mix, an apple, or a granola bar.  If you need help improving your diet, work with a health care provider or a diet and nutrition specialist (dietitian). Activity   Exercise regularly, as told by your health care provider. ? If you were active before becoming pregnant, you may be able to continue your regular fitness activities. ? If  you were not active before pregnancy, you may gradually build up to exercising for 30 or more minutes on most days of the week. This may include walking, swimming, or yoga.  Ask your health care provider what activities are safe for you. Talk with your health care provider about whether you may need to be excused from certain school or work activities. Where to find more information Learn more about managing your weight gain during pregnancy from:  American Pregnancy Association: www.americanpregnancy.org  U.[place]. Department of Agriculture pregnancy weight gain calculator: www.choosemyplate.gov Summary  Too much weight gain during pregnancy can lead to complications for you and your baby.  Find out your pre-pregnancy BMI to determine how much weight gain is healthy for you.  Eat nutritious foods and stay active.  Keep all of your prenatal visits as told by your health care provider. This information is not intended to replace advice given to you by your health care provider. Make sure you discuss any questions you have with your health care provider. Document Revised: 07/23/2019 Document Reviewed: 07/20/2017 Elsevier Patient Education  2020 [employer]  

## 2020-03-16 ENCOUNTER — Other Ambulatory Visit: Payer: Self-pay

## 2020-03-16 ENCOUNTER — Encounter: Payer: Self-pay | Admitting: Obstetrics and Gynecology

## 2020-03-16 ENCOUNTER — Encounter: Payer: Self-pay | Admitting: Surgical

## 2020-03-16 ENCOUNTER — Ambulatory Visit (INDEPENDENT_AMBULATORY_CARE_PROVIDER_SITE_OTHER): Payer: Commercial Managed Care - PPO | Admitting: Obstetrics and Gynecology

## 2020-03-16 VITALS — BP 119/81 | HR 87 | Wt 196.3 lb

## 2020-03-16 DIAGNOSIS — O35EXX0 Maternal care for other (suspected) fetal abnormality and damage, fetal genitourinary anomalies, not applicable or unspecified: Secondary | ICD-10-CM

## 2020-03-16 DIAGNOSIS — Z3A34 34 weeks gestation of pregnancy: Secondary | ICD-10-CM

## 2020-03-16 DIAGNOSIS — Z3493 Encounter for supervision of normal pregnancy, unspecified, third trimester: Secondary | ICD-10-CM

## 2020-03-16 DIAGNOSIS — O358XX0 Maternal care for other (suspected) fetal abnormality and damage, not applicable or unspecified: Secondary | ICD-10-CM

## 2020-03-16 NOTE — Progress Notes (Signed)
ROB: Now using belly band as directed.  She says it is helping some.  She has not returned to work but expects to go back to work this evening.  She says that she "forgot to go see the dietitian".  She says she is eating less fast food and trying to eat more vegetables.  I have encouraged her to go to her dietitian appointment.  Ultrasound next visit for follow-up of pyelectasis.  Cultures next visit.

## 2020-03-17 ENCOUNTER — Other Ambulatory Visit: Payer: Self-pay | Admitting: Surgical

## 2020-03-17 DIAGNOSIS — O26893 Other specified pregnancy related conditions, third trimester: Secondary | ICD-10-CM

## 2020-03-18 ENCOUNTER — Encounter: Payer: Self-pay | Admitting: Physical Therapy

## 2020-03-18 ENCOUNTER — Ambulatory Visit: Payer: Commercial Managed Care - PPO | Attending: Obstetrics and Gynecology | Admitting: Physical Therapy

## 2020-03-18 ENCOUNTER — Other Ambulatory Visit: Payer: Self-pay

## 2020-03-18 DIAGNOSIS — R102 Pelvic and perineal pain: Secondary | ICD-10-CM | POA: Insufficient documentation

## 2020-03-18 DIAGNOSIS — R278 Other lack of coordination: Secondary | ICD-10-CM | POA: Insufficient documentation

## 2020-03-18 DIAGNOSIS — R293 Abnormal posture: Secondary | ICD-10-CM | POA: Insufficient documentation

## 2020-03-18 NOTE — Therapy (Signed)
[facility] [address] [place], [place], [address] Phone: [phone]   Fax:  [phone]  Physical Therapy Evaluation  Patient Details  Name: Karen Palmer MRN: [id number] Date of Birth: 1990/12/14 Referring Provider (PT): Valentino Saxon   Encounter Date: 03/18/2020  PT End of Session - 03/18/20 1109    Visit Number  1    Number of Visits  4    PT Start Time  1100    PT Stop Time  1155    PT Time Calculation (min)  55 min    Activity Tolerance  Patient tolerated treatment well;Patient limited by pain    Behavior During Therapy  [facility] for tasks assessed/performed       Past Medical History:  Diagnosis Date  . Genital herpes 04/2018   HSV 1 on culture; neg HSV 2 IgG    Past Surgical History:  Procedure Laterality Date  . no surgery history      There were no vitals filed for this visit.    [facility] PT Assessment - 03/18/20 0001      Assessment   Medical Diagnosis  Pelvic pain affecting pregnancy    Referring Provider (PT)  Cherry    Prior Therapy  None for this       PELVIC HEALTH PHYSICAL THERAPY EVALUATION  SCREENING Red Flags: None Have you had any night sweats? Unexplained weight loss? Saddle anesthesia? Unexplained changes in bowel or bladder habits?  Precautions: 34 weeks (EDD 04/27/2020)  SUBJECTIVE  Chief Complaint: Patient states that when she walks for a certain amount of time she starts with back pain which migrates to PS and hips at times. Supine/sidelying to sit transfers are painful at the pelvis. Patient notes that she is unable to clean around the house without back pain nor pelvic pain. Patient also notes that when she steps wide she feels pain at the pubic symphysis.   Patient also notes increased shortness of breath and heart palpitations with this pregnancy as well. Patient also has to stand for the duration of her shift. Patient has been wearing belly band when active with minimal relief.    Pertinent History:  Falls Negative.  Scoliosis Negative. Pulmonary disease/dysfunction Negative. Surgical history: Negative.    Obstetrical History: G3P1 Deliveries: vaginally Tearing/Episiotomy: grade 1-2 (?) Birthing position: back  Gynecological History: Nothing of note  Urinary History: not indicated  Gastrointestinal History: not indicated  Location of pain: back, pubic symphysis Current pain:  "not so bad" Max pain: 10 /10 Least pain:  3 /10  Current activities:  Family time, video games,  Patient Goals:  Get rid of pain at back and pubic bone   OBJECTIVE  Mental Status Patient is oriented to person, place and time.  Recent memory is intact.  Remote memory is intact.  Attention span and concentration are intact.  Expressive speech is intact.  Patient's fund of knowledge is within normal limits for educational level.  POSTURE/OBSERVATIONS:  Lumbar lordosis: hyperlordotic Iliac crest height: equal bilaterally Lumbar lateral shift: negative Pelvic obliquity: mild R obliquity Leg length discrepancy: negative  GAIT: Wide based gait commensurate with stage of pregnancy  RANGE OF MOTION:    LEFT RIGHT  Lumbar forward flexion (65):  30 degrees    Lumbar extension (30): 15 degrees*    Lumbar lateral flexion (25):  WFL* WFL*  Thoracic and Lumbar rotation (30 degrees):    WFL WFL   SENSATION: Grossly intact to light touch bilateral LEs as determined by  testing dermatomes L2-S2 Proprioception and hot/cold testing deferred on this date  STRENGTH: MMT   RLE LLE  Hip Flexion 5 5  Hip Extension 5 5  Hip Abduction  3* 3*  Hip Adduction  3+ 3+  Knee Extension 5 5  Knee Flexion 5 5  Dorsiflexion  5 5  Plantarflexion (seated) 5 5   ABDOMINAL:  Diastasis: commensurate with stage of pregnancy  SPECIAL TESTS: Centralization and Peripheralization (SN 92, -LR 0.12): Negative Compression (SN/SP 69): Positive  Stork/March (SP 93): R: Negative L:  Negative  PHYSICAL PERFORMANCE MEASURES: STS: WFL   EXTERNAL PELVIC EXAM: deferred 2/2 to time constraints Palpation: Breath coordination: Cued Lengthen: Cued Contraction: Cough:  INTERNAL VAGINAL EXAM: deferred 2/2 to time constraints Introitus Appears:  Skin integrity:  Scar mobility: Strength (PERF):  Symmetry: Palpation: Prolapse:   INTERNAL RECTAL EXAM: deferred 2/2 to time constraints Strength (PERF): Symmetry: Palpation: Prolapse:   OUTCOME MEASURES: FOTO (PFDI Pain 29)   ASSESSMENT Patient is a 29 year old presenting to clinic with chief complaints of SIJ and pubic symphysis pain during third trimester of pregnancy. Upon examination, patient demonstrates deficits in posture, abdominal strength, body mechanics, and pelvic stability as evidenced by hyperlordotic posture, decreased ability to maintain pelvic neutral posture, and excess SIJ motion with unilateral leg movements . Patient's responses on FOTO outcome measures (29) indicate moderate functional limitations/disability/distress. Patient's progress may be limited due to the progressive nature of pregnancy; however, patient's motivation is advantageous. Patient was able to achieve decreased pain with active interventions during today's evaluation and responded positively to educational interventions. Patient will benefit from continued skilled therapeutic intervention to address deficits in posture, abdominal strength, body mechanics, and pelvic stability in order to increase function and improve overall QOL.  EDUCATION Patient educated on prognosis, POC, and provided with HEP including: combined pelvic curl with hip adduction isometric. Patient articulated understanding and returned demonstration. Patient will benefit from further education in order to maximize compliance and understanding for long-term therapeutic gains.  TREATMENT Neuromuscular Re-education: Patient educated on primary functions of the pelvic  floor including: posture/balance, sexual pleasure, storage and elimination of waste from the body, abdominal cavity closure, and breath coordination. Patient educated relaxin hormone and its impact on joint stability as well as the use of mm to create closure at joints. Balance disk seated pelvic curls with coordinated breath pattern for improved lengthening of erector spinae mm and force closure of pubic symphysis Seated hip adduction isometric with coordinated breath pattern for force closure of pubic symphysis Combined pelvic curl and hip adduction isometric with coordinated breath pattern for improved lengthening of erector spinae mm and force closure of pubic symphysis  Objective measurements completed on examination: See above findings.        PT Long Term Goals - 03/18/20 1533      PT LONG TERM GOAL #1   Title  Patient will demonstrate independence with HEP in order to maximize therapeutic gains and improve carryover from physical therapy sessions to ADLs in the home and community.    Baseline  IE: not demonstrated    Time  4    Period  Weeks    Status  New    Target Date  04/15/20      PT LONG TERM GOAL #2   Title  Patient will demonstrate independent and coordinated diaphragmatic breathing in supine with a 1:2 breathing pattern for improved down-regulation of the nervous system and improved management of intra-abdominal pressures in order to increase function  at home and in the community.    Baseline  IE: not demonstrated    Time  4    Period  Weeks    Status  New    Target Date  04/15/20      PT LONG TERM GOAL #3   Title  Patient will articulate and demonstrate appropriate body mechanics for improved transfers and bed mobility in order to participate in ADLs with greater independence.    Baseline  IE: not demonstrated    Time  4    Period  Weeks    Status  New    Target Date  04/15/20      PT LONG TERM GOAL #4   Title  Patient will decrease worst pain as reported on  NPRS by at least 2 points to demonstrate clinically significant reduction in pain in order to restore/improve function and overall QOL.    Baseline  IE: 10/10    Time  4    Period  Weeks    Status  New    Target Date  04/15/20             Plan - 03/18/20 1109    Clinical Impression Statement  Patient is a 29 year old presenting to clinic with chief complaints of SIJ and pubic symphysis pain during third trimester of pregnancy. Upon examination, patient demonstrates deficits in posture, abdominal strength, body mechanics, and pelvic stability as evidenced by hyperlordotic posture, decreased ability to maintain pelvic neutral posture, and excess SIJ motion with unilateral leg movements . Patient's responses on FOTO outcome measures (29) indicate moderate functional limitations/disability/distress. Patient's progress may be limited due to the progressive nature of pregnancy; however, patient's motivation is advantageous. Patient was able to achieve decreased pain with active interventions during today's evaluation and responded positively to educational interventions. Patient will benefit from continued skilled therapeutic intervention to address deficits in posture, abdominal strength, body mechanics, and pelvic stability in order to increase function and improve overall QOL.    Personal Factors and Comorbidities  Age;Education;Sex;Behavior Pattern;Past/Current Experience;Fitness;Time since onset of injury/illness/exacerbation    Examination-Activity Limitations  Dressing;Sit;Transfers;Bed Mobility;Bend;Lift;Squat;Carry;Reach Overhead;Stand;Stairs;Locomotion Level    Examination-Participation Restrictions  Interpersonal Relationship;Personal Finances;Yard Work;Laundry;Cleaning;Community Activity;Meal Prep;Driving;Shop    Stability/Clinical Decision Making  Evolving/Moderate complexity    Clinical Decision Making  Moderate    Rehab Potential  Fair    PT Frequency  1x / week    PT Duration  4  weeks    PT Treatment/Interventions  ADLs/Self Care Home Management;Aquatic Therapy;Moist Heat;Cryotherapy;Therapeutic activities;Functional mobility training;Stair training;DME Instruction;Gait training;Therapeutic exercise;Balance training;Neuromuscular re-education;Patient/family education;Manual techniques;Scar mobilization;Taping;Passive range of motion;Joint Manipulations;Spinal Manipulations    PT Next Visit Plan  TrA, breathing, PFM strengthening    PT Home Exercise Plan  pelvic curl with hip adduction    Consulted and Agree with Plan of Care  Patient       Patient will benefit from skilled therapeutic intervention in order to improve the following deficits and impairments:  Abnormal gait, Decreased balance, Decreased endurance, Decreased mobility, Difficulty walking, Pain, Postural dysfunction, Improper body mechanics, Decreased range of motion, Decreased coordination, Decreased activity tolerance, Decreased strength, Increased fascial restricitons, Impaired flexibility  Visit Diagnosis: Pelvic pain - Plan: PT plan of care cert/re-cert  Abnormal posture - Plan: PT plan of care cert/re-cert  Other lack of coordination - Plan: PT plan of care cert/re-cert     Problem List There are no problems to display for this patient.  Sheria Lang PT, DPT [phone] 03/18/2020, 3:38 PM  [facility] [address] [place], [place], [address] Phone: [phone]   Fax:  [phone]  Name: Karen Palmer MRN: [id number] Date of Birth: [birth year]

## 2020-03-25 ENCOUNTER — Other Ambulatory Visit: Payer: Self-pay

## 2020-03-25 ENCOUNTER — Encounter: Payer: Self-pay | Admitting: Physical Therapy

## 2020-03-25 ENCOUNTER — Ambulatory Visit: Payer: Commercial Managed Care - PPO | Admitting: Physical Therapy

## 2020-03-25 DIAGNOSIS — R278 Other lack of coordination: Secondary | ICD-10-CM

## 2020-03-25 DIAGNOSIS — R102 Pelvic and perineal pain: Secondary | ICD-10-CM | POA: Diagnosis not present

## 2020-03-25 DIAGNOSIS — R293 Abnormal posture: Secondary | ICD-10-CM

## 2020-03-25 NOTE — Therapy (Signed)
[facility] [address] [place], [place], [address] Phone: [phone]   Fax:  [phone]  Physical Therapy Treatment  Patient Details  Name: Karen Palmer MRN: [id number] Date of Birth: 04/28/1991 Referring Provider (PT): Marcelline Mates   Encounter Date: 03/25/2020  PT End of Session - 03/25/20 1517    Visit Number  2    Number of Visits  4    PT Start Time  1510    PT Stop Time  1605    PT Time Calculation (min)  55 min    Activity Tolerance  Patient tolerated treatment well;Patient limited by pain    Behavior During Therapy  [facility] for tasks assessed/performed       Past Medical History:  Diagnosis Date  . Genital herpes 04/2018   HSV 1 on culture; neg HSV 2 IgG    Past Surgical History:  Procedure Laterality Date  . no surgery history      There were no vitals filed for this visit.  Subjective Assessment - 03/25/20 1516    Subjective  Patient notes that she has been doing her exercises with good success, but she adds that on 3 separate occasions she had sharp pain that shot up both sides of the back after doing her exercises. Pain was 10/10 for about 2-3 seconds and then calmed down without residual pain. Patient notes she has been pain free with car transfers. Patient notes her bed mobility pain is here and there and the intensity is not as bad.    Currently in Pain?  No/denies      TREATMENT  Neuromuscular Re-education: Patient education on donning/doffing maternity support belt. Multiple options tried with greatest support achieved with inferior strap placement promoting the greatest support. Pictures of final position taken for patient use at home to replicate belt positioning. Standing postural control including:   Standing TrA activation with coordinated breath for improved force closure of pubic symphysis and SIJ  Pilates Serve a Tray with coordinated breath, YTB for improved force closure with UE  challenge   Patient educated throughout session on appropriate technique and form using multi-modal cueing, HEP, and activity modification. Patient articulated understanding and returned demonstration.  Patient Response to interventions: Patient comfortable with HEP addition of TrA.  ASSESSMENT Patient presents to clinic with excellent motivation to participate in therapy. Patient demonstrates deficits in posture, abdominal strength, body mechanics, and pelvic stability. Patient able to achieve TrA activation in standing with mod VCs and TCs during today's session and responded positively to educational and postural interventions. Patient will benefit from continued skilled therapeutic intervention to address remaining deficits in posture, abdominal strength, body mechanics, and pelvic stability in order to increase function, and improve overall QOL.    PT Long Term Goals - 03/18/20 1533      PT LONG TERM GOAL #1   Title  Patient will demonstrate independence with HEP in order to maximize therapeutic gains and improve carryover from physical therapy sessions to ADLs in the home and community.    Baseline  IE: not demonstrated    Time  4    Period  Weeks    Status  New    Target Date  04/15/20      PT LONG TERM GOAL #2   Title  Patient will demonstrate independent and coordinated diaphragmatic breathing in supine with a 1:2 breathing pattern for improved down-regulation of the nervous system and improved management of intra-abdominal pressures in order to increase function  at home and in the community.    Baseline  IE: not demonstrated    Time  4    Period  Weeks    Status  New    Target Date  04/15/20      PT LONG TERM GOAL #3   Title  Patient will articulate and demonstrate appropriate body mechanics for improved transfers and bed mobility in order to participate in ADLs with greater independence.    Baseline  IE: not demonstrated    Time  4    Period  Weeks    Status  New     Target Date  04/15/20      PT LONG TERM GOAL #4   Title  Patient will decrease worst pain as reported on NPRS by at least 2 points to demonstrate clinically significant reduction in pain in order to restore/improve function and overall QOL.    Baseline  IE: 10/10    Time  4    Period  Weeks    Status  New    Target Date  04/15/20            Plan - 03/25/20 1611    Clinical Impression Statement  Patient presents to clinic with excellent motivation to participate in therapy. Patient demonstrates deficits in posture, abdominal strength, body mechanics, and pelvic stability. Patient able to achieve TrA activation in standing with mod VCs and TCs during today's session and responded positively to educational and postural interventions. Patient will benefit from continued skilled therapeutic intervention to address remaining deficits in posture, abdominal strength, body mechanics, and pelvic stability in order to increase function, and improve overall QOL    Personal Factors and Comorbidities  Age;Education;Sex;Behavior Pattern;Past/Current Experience;Fitness;Time since onset of injury/illness/exacerbation    Examination-Activity Limitations  Dressing;Sit;Transfers;Bed Mobility;Bend;Lift;Squat;Carry;Reach Overhead;Stand;Stairs;Locomotion Level    Examination-Participation Restrictions  Interpersonal Relationship;Personal Finances;Yard Work;Laundry;Cleaning;Community Activity;Meal Prep;Driving;Shop    Stability/Clinical Decision Making  Evolving/Moderate complexity    Rehab Potential  Fair    PT Frequency  1x / week    PT Duration  4 weeks    PT Treatment/Interventions  ADLs/Self Care Home Management;Aquatic Therapy;Moist Heat;Cryotherapy;Therapeutic activities;Functional mobility training;Stair training;DME Instruction;Gait training;Therapeutic exercise;Balance training;Neuromuscular re-education;Patient/family education;Manual techniques;Scar mobilization;Taping;Passive range of motion;Joint  Manipulations;Spinal Manipulations    PT Next Visit Plan  TrA, breathing, PFM strengthening    PT Home Exercise Plan  pelvic curl with hip adduction    Consulted and Agree with Plan of Care  Patient       Patient will benefit from skilled therapeutic intervention in order to improve the following deficits and impairments:  Abnormal gait, Decreased balance, Decreased endurance, Decreased mobility, Difficulty walking, Pain, Postural dysfunction, Improper body mechanics, Decreased range of motion, Decreased coordination, Decreased activity tolerance, Decreased strength, Increased fascial restricitons, Impaired flexibility  Visit Diagnosis: Pelvic pain  Abnormal posture  Other lack of coordination     Problem List There are no problems to display for this patient.  Sheria Lang PT, DPT [phone]  03/25/2020, 4:17 PM  [facility] [address] [place], [place], [address] Phone: [phone]   Fax:  [phone]  Name: Jamella Grayer MRN: [id number] Date of Birth: July 31, 1991

## 2020-03-29 ENCOUNTER — Encounter: Payer: Self-pay | Admitting: Physical Therapy

## 2020-03-29 ENCOUNTER — Other Ambulatory Visit: Payer: Self-pay

## 2020-03-29 ENCOUNTER — Ambulatory Visit: Payer: Commercial Managed Care - PPO | Admitting: Physical Therapy

## 2020-03-29 DIAGNOSIS — R102 Pelvic and perineal pain: Secondary | ICD-10-CM

## 2020-03-29 DIAGNOSIS — R278 Other lack of coordination: Secondary | ICD-10-CM

## 2020-03-29 DIAGNOSIS — R293 Abnormal posture: Secondary | ICD-10-CM

## 2020-03-29 NOTE — Therapy (Signed)
[facility] [address]. [place], [place], [address] Phone: [phone]   Fax:  [phone]  Physical Therapy Treatment  Patient Details  Name: Karen Palmer MRN: [id number] Date of Birth: 07-27-1991 Referring Provider (PT): Marcelline Mates   Encounter Date: 03/29/2020  PT End of Session - 03/29/20 1746    Visit Number  3    Number of Visits  4    PT Start Time  1502    PT Stop Time  1542    PT Time Calculation (min)  40 min    Activity Tolerance  Patient tolerated treatment well;Patient limited by pain    Behavior During Therapy  [facility] for tasks assessed/performed       Past Medical History:  Diagnosis Date  . Genital herpes 04/2018   HSV 1 on culture; neg HSV 2 IgG    Past Surgical History:  Procedure Laterality Date  . no surgery history      There were no vitals filed for this visit.  Subjective Assessment - 03/29/20 1704    Subjective  Patient states that she is having a bad day with respect to her back pain which she points to her R QL approximately. Patient notes that she wore the maternity support belt to clean and it helped a little bit (enough so she could get around).    Currently in Pain?  No/denies       TREATMENT Manual Therapy: STM and TPR performed to B lumbar paraspinals and QL to allow for decreased tension and pain and improved posture and function  Neuromuscular Re-education: Seated child's pose at table with side bend component for improved pain and mm spasm relief Seated TrA 50% MVC contraction for improved postural strength and decreased lumbar mm spasm Patient education on birth position concepts, positions/postures to prepare for birth, and strategies to manage IAP during labor and delivery.   Patient educated throughout session on appropriate technique and form using multi-modal cueing, HEP, and activity modification. Patient articulated understanding and returned demonstration.  Patient Response  to interventions: Patient states "It's fine now" in response to "How's your pain?" after manual interventions and stretches  ASSESSMENT Patient presents to clinic with excellent motivation to participate in therapy. Patient demonstrates deficits in posture, abdominal strength, body mechanics, and pelvic stability. Patient performed seated TrA with exceptional form during today's session and responded positively to educational and postural interventions. Patient will benefit from continued skilled therapeutic intervention to address remaining deficits in posture, abdominal strength, body mechanics, and pelvic stability in order to increase function, and improve overall QOL.    PT Long Term Goals - 03/18/20 1533      PT LONG TERM GOAL #1   Title  Patient will demonstrate independence with HEP in order to maximize therapeutic gains and improve carryover from physical therapy sessions to ADLs in the home and community.    Baseline  IE: not demonstrated    Time  4    Period  Weeks    Status  New    Target Date  04/15/20      PT LONG TERM GOAL #2   Title  Patient will demonstrate independent and coordinated diaphragmatic breathing in supine with a 1:2 breathing pattern for improved down-regulation of the nervous system and improved management of intra-abdominal pressures in order to increase function at home and in the community.    Baseline  IE: not demonstrated    Time  4    Period  Weeks  Status  New    Target Date  04/15/20      PT LONG TERM GOAL #3   Title  Patient will articulate and demonstrate appropriate body mechanics for improved transfers and bed mobility in order to participate in ADLs with greater independence.    Baseline  IE: not demonstrated    Time  4    Period  Weeks    Status  New    Target Date  04/15/20      PT LONG TERM GOAL #4   Title  Patient will decrease worst pain as reported on NPRS by at least 2 points to demonstrate clinically significant reduction in  pain in order to restore/improve function and overall QOL.    Baseline  IE: 10/10    Time  4    Period  Weeks    Status  New    Target Date  04/15/20            Plan - 03/29/20 1746    Clinical Impression Statement  Patient presents to clinic with excellent motivation to participate in therapy. Patient demonstrates deficits in posture, abdominal strength, body mechanics, and pelvic stability. Patient performed seated TrA with exceptional form during today's session and responded positively to educational and postural interventions. Patient will benefit from continued skilled therapeutic intervention to address remaining deficits in posture, abdominal strength, body mechanics, and pelvic stability in order to increase function, and improve overall QOL.    Personal Factors and Comorbidities  Age;Education;Sex;Behavior Pattern;Past/Current Experience;Fitness;Time since onset of injury/illness/exacerbation    Examination-Activity Limitations  Dressing;Sit;Transfers;Bed Mobility;Bend;Lift;Squat;Carry;Reach Overhead;Stand;Stairs;Locomotion Level    Examination-Participation Restrictions  Interpersonal Relationship;Personal Finances;Yard Work;Laundry;Cleaning;Community Activity;Meal Prep;Driving;Shop    Stability/Clinical Decision Making  Evolving/Moderate complexity    Rehab Potential  Fair    PT Frequency  1x / week    PT Duration  4 weeks    PT Treatment/Interventions  ADLs/Self Care Home Management;Aquatic Therapy;Moist Heat;Cryotherapy;Therapeutic activities;Functional mobility training;Stair training;DME Instruction;Gait training;Therapeutic exercise;Balance training;Neuromuscular re-education;Patient/family education;Manual techniques;Scar mobilization;Taping;Passive range of motion;Joint Manipulations;Spinal Manipulations    PT Next Visit Plan  TrA, breathing, PFM strengthening    PT Home Exercise Plan  pelvic curl with hip adduction    Consulted and Agree with Plan of Care  Patient        Patient will benefit from skilled therapeutic intervention in order to improve the following deficits and impairments:  Abnormal gait, Decreased balance, Decreased endurance, Decreased mobility, Difficulty walking, Pain, Postural dysfunction, Improper body mechanics, Decreased range of motion, Decreased coordination, Decreased activity tolerance, Decreased strength, Increased fascial restricitons, Impaired flexibility  Visit Diagnosis: Pelvic pain  Abnormal posture  Other lack of coordination     Problem List There are no problems to display for this patient.  Sheria Lang PT, DPT [phone] 03/29/2020, 5:52 PM  [facility] [address] [place], [place], [address] Phone: [phone]   Fax:  [phone]  Name: Karen Palmer MRN: [id number] Date of Birth: 1991-03-31

## 2020-03-30 ENCOUNTER — Encounter: Payer: Commercial Managed Care - PPO | Admitting: Obstetrics and Gynecology

## 2020-04-05 ENCOUNTER — Telehealth: Payer: Self-pay | Admitting: Obstetrics and Gynecology

## 2020-04-05 NOTE — Telephone Encounter (Signed)
Patient called in saying she needed the nurse to send her over paperwork for FMLA for her treatment plan following May 5th. The note needs to include that she is undergoing physical therapy and so on. Could you please advise?

## 2020-04-06 ENCOUNTER — Telehealth: Payer: Self-pay | Admitting: Obstetrics and Gynecology

## 2020-04-06 ENCOUNTER — Ambulatory Visit (INDEPENDENT_AMBULATORY_CARE_PROVIDER_SITE_OTHER): Payer: Commercial Managed Care - PPO | Admitting: Obstetrics and Gynecology

## 2020-04-06 ENCOUNTER — Encounter: Payer: Self-pay | Admitting: Obstetrics and Gynecology

## 2020-04-06 ENCOUNTER — Ambulatory Visit (INDEPENDENT_AMBULATORY_CARE_PROVIDER_SITE_OTHER): Payer: Commercial Managed Care - PPO

## 2020-04-06 ENCOUNTER — Other Ambulatory Visit: Payer: Self-pay

## 2020-04-06 VITALS — BP 136/91 | HR 76 | Wt 207.3 lb

## 2020-04-06 DIAGNOSIS — O35EXX0 Maternal care for other (suspected) fetal abnormality and damage, fetal genitourinary anomalies, not applicable or unspecified: Secondary | ICD-10-CM

## 2020-04-06 DIAGNOSIS — O2603 Excessive weight gain in pregnancy, third trimester: Secondary | ICD-10-CM

## 2020-04-06 DIAGNOSIS — O358XX0 Maternal care for other (suspected) fetal abnormality and damage, not applicable or unspecified: Secondary | ICD-10-CM

## 2020-04-06 DIAGNOSIS — R102 Pelvic and perineal pain: Secondary | ICD-10-CM

## 2020-04-06 DIAGNOSIS — Z113 Encounter for screening for infections with a predominantly sexual mode of transmission: Secondary | ICD-10-CM

## 2020-04-06 DIAGNOSIS — Z3483 Encounter for supervision of other normal pregnancy, third trimester: Secondary | ICD-10-CM

## 2020-04-06 DIAGNOSIS — Z3A37 37 weeks gestation of pregnancy: Secondary | ICD-10-CM

## 2020-04-06 DIAGNOSIS — O26893 Other specified pregnancy related conditions, third trimester: Secondary | ICD-10-CM

## 2020-04-06 LAB — POCT URINALYSIS DIPSTICK OB
Bilirubin, UA: NEGATIVE
Blood, UA: NEGATIVE
Glucose, UA: NEGATIVE
Ketones, UA: NEGATIVE
Leukocytes, UA: NEGATIVE
Nitrite, UA: NEGATIVE
POC,PROTEIN,UA: NEGATIVE
Spec Grav, UA: 1.015 (ref 1.010–1.025)
Urobilinogen, UA: 0.2 E.U./dL
pH, UA: 6 (ref 5.0–8.0)

## 2020-04-06 NOTE — Progress Notes (Signed)
ROB: Patient still having some pelvic pain. She notes she has been seeing physical therapy and it has helped.Needs letter stating progress of physical therapy for her job.  Is noticing more of a mucoid discharge. Discussed it could possibly be mucus plug.  Third trimester cultures done. S/p repeat ultrasound for pyelectasis, still present, unchanged. Will f/u once post-natally with Pediatrician. BP borderline elevated today, urinalysis negative for protein. Also still noting weight gain, TWG 67 lbs. Did not see dietician as recommended. Will continue to monitor.  RTC in 1 week.

## 2020-04-06 NOTE — Telephone Encounter (Signed)
Karen Palmer

## 2020-04-06 NOTE — Telephone Encounter (Signed)
I have written her letter in Epic. Can be faxed to the number provided.  I can sign before you fax it.

## 2020-04-06 NOTE — Progress Notes (Signed)
ROB-Pt present for routine prenatal care and 36 week labs. Pt stated still having some pain and noticed a charge in her discharge that is more thick and mucous.

## 2020-04-06 NOTE — Telephone Encounter (Signed)
Pt called in and stated that her paper work for FMLA needs either the return to work date or her next appt on the paperwork the fax number is [phone]. Please advise

## 2020-04-07 NOTE — Telephone Encounter (Signed)
Pt called no answer and unable to LM due to VM has not been set up. Sent pt a mychart message informing her that [facility] had completed the letter and it had been faxed to the number she provided which was [phone].

## 2020-04-08 ENCOUNTER — Encounter: Payer: Self-pay | Admitting: Physical Therapy

## 2020-04-08 ENCOUNTER — Other Ambulatory Visit: Payer: Self-pay

## 2020-04-08 ENCOUNTER — Ambulatory Visit: Payer: Commercial Managed Care - PPO | Admitting: Physical Therapy

## 2020-04-08 DIAGNOSIS — R102 Pelvic and perineal pain: Secondary | ICD-10-CM

## 2020-04-08 DIAGNOSIS — R293 Abnormal posture: Secondary | ICD-10-CM

## 2020-04-08 DIAGNOSIS — R278 Other lack of coordination: Secondary | ICD-10-CM

## 2020-04-08 LAB — GC/CHLAMYDIA PROBE AMP
Chlamydia trachomatis, NAA: NEGATIVE
Neisseria Gonorrhoeae by PCR: NEGATIVE

## 2020-04-08 NOTE — Therapy (Signed)
[facility] [address] [place], [place], [address] Phone: [phone]   Fax:  [phone]  Physical Therapy Treatment  Patient Details  Name: Karen Palmer MRN: [id number] Date of Birth: 1990-12-27 Referring Provider (PT): Marcelline Mates   Encounter Date: 04/08/2020  PT End of Session - 04/08/20 1009    Visit Number  4    Number of Visits  4    PT Start Time  1005    PT Stop Time  1100    PT Time Calculation (min)  55 min    Activity Tolerance  Patient tolerated treatment well    Behavior During Therapy  [facility] for tasks assessed/performed       Past Medical History:  Diagnosis Date  . Genital herpes 04/2018   HSV 1 on culture; neg HSV 2 IgG    Past Surgical History:  Procedure Laterality Date  . no surgery history      There were no vitals filed for this visit.  Subjective Assessment - 04/08/20 1007    Subjective  Patient notes that she feels her son has dropped lower since her last visit. She reports that she had 1 day with increased pain (10/10) and was unable to be out of the bed. Patient states that today she is alright.    Currently in Pain?  No/denies        TREATMENT  Neuromuscular Re-education: Patient educated on birthing positions that will minimize sacrum and coccyx pain. Patient educated on the benefits of hip ER for opening the superior portion of the pelvis and hip IR for opening the base/inferior portion of the pelvis for improved ease of delivery.  Patient educated on basic coordination activities to perform after delivery including: diaphragmatic breathing, "HUT", and 30% MVC TrA activation with breath.   Patient educated throughout session on appropriate technique and form using multi-modal cueing, HEP, and activity modification. Patient articulated understanding and returned demonstration.  Patient Response to interventions: Denies any further concerns.  ASSESSMENT Patient presents to clinic with  excellent motivation to participate in therapy. Patient demonstrates deficits in posture, abdominal strength, body mechanics, and pelvic stability. Patient noted to have 21 point reduction in pain on PFDI Pain scale during today's session and responded positively to educational interventions. Patient is appropriate to discharge to self-management at this time, but will likely benefit from continued physical therapy intervention post-partum to address any remaining deficits in posture, abdominal strength, body mechanics, and pelvic stability in order to increase function, and improve overall QOL.          PT Long Term Goals - 04/08/20 1616      PT LONG TERM GOAL #1   Title  Patient will demonstrate independence with HEP in order to maximize therapeutic gains and improve carryover from physical therapy sessions to ADLs in the home and community.    Baseline  IE: not demonstrated; 5/27: IND    Time  4    Period  Weeks    Status  Achieved      PT LONG TERM GOAL #2   Title  Patient will demonstrate independent and coordinated diaphragmatic breathing in supine with a 1:2 breathing pattern for improved down-regulation of the nervous system and improved management of intra-abdominal pressures in order to increase function at home and in the community.    Baseline  IE: not demonstrated; 5/27: limited due to stage of pregnancy, but patient able to articulate concept    Time  4  Period  Weeks    Status  Partially Met      PT LONG TERM GOAL #3   Title  Patient will articulate and demonstrate appropriate body mechanics for improved transfers and bed mobility in order to participate in ADLs with greater independence.    Baseline  IE: not demonstrated; 5/27: IND    Time  4    Period  Weeks    Status  Achieved      PT LONG TERM GOAL #4   Title  Patient will decrease worst pain as reported on NPRS by at least 2 points to demonstrate clinically significant reduction in pain in order to  restore/improve function and overall QOL.    Baseline  IE: 10/10; 5/27: 10/10    Time  4    Period  Weeks    Status  Not Met            Plan - 04/08/20 1011    Clinical Impression Statement  Patient presents to clinic with excellent motivation to participate in therapy. Patient demonstrates deficits in posture, abdominal strength, body mechanics, and pelvic stability. Patient noted to have 21 point reduction in pain on PFDI Pain scale during today's session and responded positively to educational interventions. Patient is appropriate to discharge to self-management at this time, but will likely benefit from continued physical therapy intervention post-partum to address any remaining deficits in posture, abdominal strength, body mechanics, and pelvic stability in order to increase function, and improve overall QOL.    Personal Factors and Comorbidities  Age;Education;Sex;Behavior Pattern;Past/Current Experience;Fitness;Time since onset of injury/illness/exacerbation    Examination-Activity Limitations  Dressing;Sit;Transfers;Bed Mobility;Bend;Lift;Squat;Carry;Reach Overhead;Stand;Stairs;Locomotion Level    Examination-Participation Restrictions  Interpersonal Relationship;Personal Finances;Yard Work;Laundry;Cleaning;Community Activity;Meal Prep;Driving;Shop    Stability/Clinical Decision Making  Evolving/Moderate complexity    Rehab Potential  Fair    PT Frequency  1x / week    PT Duration  4 weeks    PT Treatment/Interventions  ADLs/Self Care Home Management;Aquatic Therapy;Moist Heat;Cryotherapy;Therapeutic activities;Functional mobility training;Stair training;DME Instruction;Gait training;Therapeutic exercise;Balance training;Neuromuscular re-education;Patient/family education;Manual techniques;Scar mobilization;Taping;Passive range of motion;Joint Manipulations;Spinal Manipulations    PT Next Visit Plan  TrA, breathing, PFM strengthening    PT Home Exercise Plan  pelvic curl with hip  adduction    Consulted and Agree with Plan of Care  Patient       Patient will benefit from skilled therapeutic intervention in order to improve the following deficits and impairments:  Abnormal gait, Decreased balance, Decreased endurance, Decreased mobility, Difficulty walking, Pain, Postural dysfunction, Improper body mechanics, Decreased range of motion, Decreased coordination, Decreased activity tolerance, Decreased strength, Increased fascial restricitons, Impaired flexibility  Visit Diagnosis: Pelvic pain  Abnormal posture  Other lack of coordination     Problem List There are no problems to display for this patient.  Myles Gip PT, DPT [phone] 04/08/2020, 4:23 PM  [facility] [address] [place], [place], [address] Phone: [phone]   Fax:  [phone]  Name: Karen Palmer MRN: [id number] Date of Birth: 1990/12/25

## 2020-04-09 LAB — STREP GP B NAA: Strep Gp B NAA: POSITIVE — AB

## 2020-04-13 NOTE — Progress Notes (Signed)
ROB-Pt present for routine prenatal care. Pt stated having pain and pressure with irritability contractions.

## 2020-04-14 ENCOUNTER — Ambulatory Visit (INDEPENDENT_AMBULATORY_CARE_PROVIDER_SITE_OTHER): Payer: Commercial Managed Care - PPO | Admitting: Obstetrics and Gynecology

## 2020-04-14 ENCOUNTER — Encounter: Payer: Self-pay | Admitting: Obstetrics and Gynecology

## 2020-04-14 ENCOUNTER — Other Ambulatory Visit: Payer: Self-pay

## 2020-04-14 VITALS — BP 133/86 | HR 80 | Wt 204.9 lb

## 2020-04-14 DIAGNOSIS — Z3A38 38 weeks gestation of pregnancy: Secondary | ICD-10-CM

## 2020-04-14 DIAGNOSIS — O2603 Excessive weight gain in pregnancy, third trimester: Secondary | ICD-10-CM

## 2020-04-14 DIAGNOSIS — Z3483 Encounter for supervision of other normal pregnancy, third trimester: Secondary | ICD-10-CM

## 2020-04-14 LAB — POCT URINALYSIS DIPSTICK OB
Bilirubin, UA: NEGATIVE
Blood, UA: NEGATIVE
Glucose, UA: NEGATIVE
Ketones, UA: NEGATIVE
Leukocytes, UA: NEGATIVE
Nitrite, UA: NEGATIVE
Spec Grav, UA: 1.015 (ref 1.010–1.025)
Urobilinogen, UA: 0.2 E.U./dL
pH, UA: 7 (ref 5.0–8.0)

## 2020-04-14 NOTE — Patient Instructions (Signed)
Common Medications Safe in Pregnancy  Acne:      Constipation:  Benzoyl Peroxide     Colace  Clindamycin      Dulcolax Suppository  Topica Erythromycin     Fibercon  Salicylic Acid      Metamucil         Miralax AVOID:        Senakot   Accutane    Cough:  Retin-A       Cough Drops  Tetracycline      Phenergan w/ Codeine if Rx  Minocycline      Robitussin (Plain & DM)  Antibiotics:     Crabs/Lice:  Ceclor       RID  Cephalosporins    AVOID:  E-Mycins      Kwell  Keflex  Macrobid/Macrodantin   Diarrhea:  Penicillin      Kao-Pectate  Zithromax      Imodium AD         PUSH FLUIDS AVOID:       Cipro     Fever:  Tetracycline      Tylenol (Regular or Extra  Minocycline       Strength)  Levaquin      Extra Strength-Do not          Exceed 8 tabs/24 hrs Caffeine:        <235m/day (equiv. To 1 cup of coffee or  approx. 3 12 oz sodas)         Gas: Cold/Hayfever:       Gas-X  Benadryl      Mylicon  Claritin       Phazyme  **Claritin-D        Chlor-Trimeton    Headaches:  Dimetapp      ASA-Free Excedrin  Drixoral-Non-Drowsy     Cold Compress  Mucinex (Guaifenasin)     Tylenol (Regular or Extra  Sudafed/Sudafed-12 Hour     Strength)  **Sudafed PE Pseudoephedrine   Tylenol Cold & Sinus     Vicks Vapor Rub  Zyrtec  **AVOID if Problems With Blood Pressure         Heartburn: Avoid lying down for at least 1 hour after meals  Aciphex      Maalox     Rash:  Milk of Magnesia     Benadryl    Mylanta       1% Hydrocortisone Cream  Pepcid  Pepcid Complete   Sleep Aids:  Prevacid      Ambien   Prilosec       Benadryl  Rolaids       Chamomile Tea  Tums (Limit 4/day)     Unisom  Zantac       Tylenol PM         Warm milk-add vanilla or  Hemorrhoids:       Sugar for taste  Anusol/Anusol H.C.  (RX: Analapram 2.5%)  Sugar Substitutes:  Hydrocortisone OTC     Ok in moderation  Preparation H      Tucks        Vaseline lotion applied to tissue with  wiping    Herpes:     Throat:  Acyclovir      Oragel  Famvir  Valtrex     Vaccines:         Flu Shot Leg Cramps:       *Gardasil  Benadryl      Hepatitis A         Hepatitis B Nasal Spray:  Pneumovax  Saline Nasal Spray     Polio Booster         Tetanus Nausea:       Tuberculosis test or PPD  Vitamin B6 25 mg TID   AVOID:    Dramamine      *Gardasil  Emetrol       Live Poliovirus  Ginger Root 250 mg QID    MMR (measles, mumps &  High Complex Carbs @ Bedtime    rebella)  Sea Bands-Accupressure    Varicella (Chickenpox)  Unisom 1/2 tab TID     *No known complications           If received before Pain:         Known pregnancy;   Darvocet       Resume series after  Lortab        Delivery  Percocet    Yeast:   Tramadol      Femstat  Tylenol 3      Gyne-lotrimin  Ultram       Monistat  Vicodin           MISC:         All Sunscreens           Hair Coloring/highlights          Insect Repellant's          (Including DEET)         Mystic Tans Breastfeeding  Choosing to breastfeed is one of the best decisions you can make for yourself and your baby. A change in hormones during pregnancy causes your breasts to make breast milk in your milk-producing glands. Hormones prevent breast milk from being released before your baby is born. They also prompt milk flow after birth. Once breastfeeding has begun, thoughts of your baby, as well as his or her sucking or crying, can stimulate the release of milk from your milk-producing glands. Benefits of breastfeeding Research shows that breastfeeding offers many health benefits for infants and mothers. It also offers a cost-free and convenient way to feed your baby. For your baby  Your first milk (colostrum) helps your baby's digestive system to function better.  Special cells in your milk (antibodies) help your baby to fight off infections.  Breastfed babies are less likely to develop asthma, allergies, obesity, or type 2 diabetes. They  are also at lower risk for sudden infant death syndrome (SIDS).  Nutrients in breast milk are better able to meet your baby's needs compared to infant formula.  Breast milk improves your baby's brain development. For you  Breastfeeding helps to create a very special bond between you and your baby.  Breastfeeding is convenient. Breast milk costs nothing and is always available at the correct temperature.  Breastfeeding helps to burn calories. It helps you to lose the weight that you gained during pregnancy.  Breastfeeding makes your uterus return faster to its size before pregnancy. It also slows bleeding (lochia) after you give birth.  Breastfeeding helps to lower your risk of developing type 2 diabetes, osteoporosis, rheumatoid arthritis, cardiovascular disease, and breast, ovarian, uterine, and endometrial cancer later in life. Breastfeeding basics Starting breastfeeding  Find a comfortable place to sit or lie down, with your neck and back well-supported.  Place a pillow or a rolled-up blanket under your baby to bring him or her to the level of your breast (if you are seated). Nursing pillows are specially designed to help support your arms and your baby while you   breastfeed.  Make sure that your baby's tummy (abdomen) is facing your abdomen.  Gently massage your breast. With your fingertips, massage from the outer edges of your breast inward toward the nipple. This encourages milk flow. If your milk flows slowly, you may need to continue this action during the feeding.  Support your breast with 4 fingers underneath and your thumb above your nipple (make the letter "C" with your hand). Make sure your fingers are well away from your nipple and your baby's mouth.  Stroke your baby's lips gently with your finger or nipple.  When your baby's mouth is open wide enough, quickly bring your baby to your breast, placing your entire nipple and as much of the areola as possible into your baby's  mouth. The areola is the colored area around your nipple. ? More areola should be visible above your baby's upper lip than below the lower lip. ? Your baby's lips should be opened and extended outward (flanged) to ensure an adequate, comfortable latch. ? Your baby's tongue should be between his or her lower gum and your breast.  Make sure that your baby's mouth is correctly positioned around your nipple (latched). Your baby's lips should create a seal on your breast and be turned out (everted).  It is common for your baby to suck about 2-3 minutes in order to start the flow of breast milk. Latching Teaching your baby how to latch onto your breast properly is very important. An improper latch can cause nipple pain, decreased milk supply, and poor weight gain in your baby. Also, if your baby is not latched onto your nipple properly, he or she may swallow some air during feeding. This can make your baby fussy. Burping your baby when you switch breasts during the feeding can help to get rid of the air. However, teaching your baby to latch on properly is still the best way to prevent fussiness from swallowing air while breastfeeding. Signs that your baby has successfully latched onto your nipple  Silent tugging or silent sucking, without causing you pain. Infant's lips should be extended outward (flanged).  Swallowing heard between every 3-4 sucks once your milk has started to flow (after your let-down milk reflex occurs).  Muscle movement above and in front of his or her ears while sucking. Signs that your baby has not successfully latched onto your nipple  Sucking sounds or smacking sounds from your baby while breastfeeding.  Nipple pain. If you think your baby has not latched on correctly, slip your finger into the corner of your baby's mouth to break the suction and place it between your baby's gums. Attempt to start breastfeeding again. Signs of successful breastfeeding Signs from your  baby  Your baby will gradually decrease the number of sucks or will completely stop sucking.  Your baby will fall asleep.  Your baby's body will relax.  Your baby will retain a small amount of milk in his or her mouth.  Your baby will let go of your breast by himself or herself. Signs from you  Breasts that have increased in firmness, weight, and size 1-3 hours after feeding.  Breasts that are softer immediately after breastfeeding.  Increased milk volume, as well as a change in milk consistency and color by the fifth day of breastfeeding.  Nipples that are not sore, cracked, or bleeding. Signs that your baby is getting enough milk  Wetting at least 1-2 diapers during the first 24 hours after birth.  Wetting at least 5-6   diapers every 24 hours for the first week after birth. The urine should be clear or pale yellow by the age of 5 days.  Wetting 6-8 diapers every 24 hours as your baby continues to grow and develop.  At least 3 stools in a 24-hour period by the age of 5 days. The stool should be soft and yellow.  At least 3 stools in a 24-hour period by the age of 7 days. The stool should be seedy and yellow.  No loss of weight greater than 10% of birth weight during the first 3 days of life.  Average weight gain of 4-7 oz (113-198 g) per week after the age of 4 days.  Consistent daily weight gain by the age of 5 days, without weight loss after the age of 2 weeks. After a feeding, your baby may spit up a small amount of milk. This is normal. Breastfeeding frequency and duration Frequent feeding will help you make more milk and can prevent sore nipples and extremely full breasts (breast engorgement). Breastfeed when you feel the need to reduce the fullness of your breasts or when your baby shows signs of hunger. This is called "breastfeeding on demand." Signs that your baby is hungry include:  Increased alertness, activity, or restlessness.  Movement of the head from side to  side.  Opening of the mouth when the corner of the mouth or cheek is stroked (rooting).  Increased sucking sounds, smacking lips, cooing, sighing, or squeaking.  Hand-to-mouth movements and sucking on fingers or hands.  Fussing or crying. Avoid introducing a pacifier to your baby in the first 4-6 weeks after your baby is born. After this time, you may choose to use a pacifier. Research has shown that pacifier use during the first year of a baby's life decreases the risk of sudden infant death syndrome (SIDS). Allow your baby to feed on each breast as long as he or she wants. When your baby unlatches or falls asleep while feeding from the first breast, offer the second breast. Because newborns are often sleepy in the first few weeks of life, you may need to awaken your baby to get him or her to feed. Breastfeeding times will vary from baby to baby. However, the following rules can serve as a guide to help you make sure that your baby is properly fed:  Newborns (babies 4 weeks of age or younger) may breastfeed every 1-3 hours.  Newborns should not go without breastfeeding for longer than 3 hours during the day or 5 hours during the night.  You should breastfeed your baby a minimum of 8 times in a 24-hour period. Breast milk pumping     Pumping and storing breast milk allows you to make sure that your baby is exclusively fed your breast milk, even at times when you are unable to breastfeed. This is especially important if you go back to work while you are still breastfeeding, or if you are not able to be present during feedings. Your lactation consultant can help you find a method of pumping that works best for you and give you guidelines about how long it is safe to store breast milk. Caring for your breasts while you breastfeed Nipples can become dry, cracked, and sore while breastfeeding. The following recommendations can help keep your breasts moisturized and healthy:  Avoid using soap on  your nipples.  Wear a supportive bra designed especially for nursing. Avoid wearing underwire-style bras or extremely tight bras (sports bras).  Air-dry your   nipples for 3-4 minutes after each feeding.  Use only cotton bra pads to absorb leaked breast milk. Leaking of breast milk between feedings is normal.  Use lanolin on your nipples after breastfeeding. Lanolin helps to maintain your skin's normal moisture barrier. Pure lanolin is not harmful (not toxic) to your baby. You may also hand express a few drops of breast milk and gently massage that milk into your nipples and allow the milk to air-dry. In the first few weeks after giving birth, some women experience breast engorgement. Engorgement can make your breasts feel heavy, warm, and tender to the touch. Engorgement peaks within 3-5 days after you give birth. The following recommendations can help to ease engorgement:  Completely empty your breasts while breastfeeding or pumping. You may want to start by applying warm, moist heat (in the shower or with warm, water-soaked hand towels) just before feeding or pumping. This increases circulation and helps the milk flow. If your baby does not completely empty your breasts while breastfeeding, pump any extra milk after he or she is finished.  Apply ice packs to your breasts immediately after breastfeeding or pumping, unless this is too uncomfortable for you. To do this: ? Put ice in a plastic bag. ? Place a towel between your skin and the bag. ? Leave the ice on for 20 minutes, 2-3 times a day.  Make sure that your baby is latched on and positioned properly while breastfeeding. If engorgement persists after 48 hours of following these recommendations, contact your health care provider or a lactation consultant. Overall health care recommendations while breastfeeding  Eat 3 healthy meals and 3 snacks every day. Well-nourished mothers who are breastfeeding need an additional 450-500 calories a day.  You can meet this requirement by increasing the amount of a balanced diet that you eat.  Drink enough water to keep your urine pale yellow or clear.  Rest often, relax, and continue to take your prenatal vitamins to prevent fatigue, stress, and low vitamin and mineral levels in your body (nutrient deficiencies).  Do not use any products that contain nicotine or tobacco, such as cigarettes and e-cigarettes. Your baby may be harmed by chemicals from cigarettes that pass into breast milk and exposure to secondhand smoke. If you need help quitting, ask your health care provider.  Avoid alcohol.  Do not use illegal drugs or marijuana.  Talk with your health care provider before taking any medicines. These include over-the-counter and prescription medicines as well as vitamins and herbal supplements. Some medicines that may be harmful to your baby can pass through breast milk.  It is possible to become pregnant while breastfeeding. If birth control is desired, ask your health care provider about options that will be safe while breastfeeding your baby. Where to find more information: [employer]: www.llli.org Contact a health care provider if:  You feel like you want to stop breastfeeding or have become frustrated with breastfeeding.  Your nipples are cracked or bleeding.  Your breasts are red, tender, or warm.  You have: ? Painful breasts or nipples. ? A swollen area on either breast. ? A fever or chills. ? Nausea or vomiting. ? Drainage other than breast milk from your nipples.  Your breasts do not become full before feedings by the fifth day after you give birth.  You feel sad and depressed.  Your baby is: ? Too sleepy to eat well. ? Having trouble sleeping. ? More than 1 week old and wetting fewer than 6   diapers in a 24-hour period. ? Not gaining weight by 5 days of age.  Your baby has fewer than 3 stools in a 24-hour period.  Your baby's skin or the white  parts of his or her eyes become yellow. Get help right away if:  Your baby is overly tired (lethargic) and does not want to wake up and feed.  Your baby develops an unexplained fever. Summary  Breastfeeding offers many health benefits for infant and mothers.  Try to breastfeed your infant when he or she shows early signs of hunger.  Gently tickle or stroke your baby's lips with your finger or nipple to allow the baby to open his or her mouth. Bring the baby to your breast. Make sure that much of the areola is in your baby's mouth. Offer one side and burp the baby before you offer the other side.  Talk with your health care provider or lactation consultant if you have questions or you face problems as you breastfeed. This information is not intended to replace advice given to you by your health care provider. Make sure you discuss any questions you have with your health care provider. Document Revised: 01/24/2018 Document Reviewed: 12/01/2016 Elsevier Patient Education  2020 [employer]  

## 2020-04-14 NOTE — Progress Notes (Signed)
ROB: Discussed GBS+ status, need for antibiotics. Also still noting Braxton Hicks contractions at night. Wakes her out of her sleep at times, but does not last long. Discussed labor precautions. Desires epidural during labor. Declines cervical exam today. Continue to monitor BPs, have been borderline last 2 visits. RTC in 1 week.

## 2020-04-23 ENCOUNTER — Other Ambulatory Visit: Payer: Self-pay

## 2020-04-23 ENCOUNTER — Ambulatory Visit (INDEPENDENT_AMBULATORY_CARE_PROVIDER_SITE_OTHER): Payer: Commercial Managed Care - PPO | Admitting: Obstetrics and Gynecology

## 2020-04-23 ENCOUNTER — Encounter: Payer: Commercial Managed Care - PPO | Admitting: Obstetrics and Gynecology

## 2020-04-23 ENCOUNTER — Encounter: Payer: Self-pay | Admitting: Obstetrics and Gynecology

## 2020-04-23 VITALS — BP 129/86 | HR 90 | Wt 206.9 lb

## 2020-04-23 DIAGNOSIS — Z3A39 39 weeks gestation of pregnancy: Secondary | ICD-10-CM

## 2020-04-23 DIAGNOSIS — R102 Pelvic and perineal pain: Secondary | ICD-10-CM

## 2020-04-23 DIAGNOSIS — O26893 Other specified pregnancy related conditions, third trimester: Secondary | ICD-10-CM

## 2020-04-23 DIAGNOSIS — Z3483 Encounter for supervision of other normal pregnancy, third trimester: Secondary | ICD-10-CM

## 2020-04-23 LAB — POCT URINALYSIS DIPSTICK OB
Bilirubin, UA: NEGATIVE
Blood, UA: NEGATIVE
Glucose, UA: NEGATIVE
Ketones, UA: NEGATIVE
Leukocytes, UA: NEGATIVE
Nitrite, UA: NEGATIVE
Spec Grav, UA: >=1.03 — AB (ref 1.010–1.025)
Urobilinogen, UA: 0.2 E.U./dL
pH, UA: 6 (ref 5.0–8.0)

## 2020-04-23 NOTE — Progress Notes (Signed)
ROB-Pt present for routine prenatal care. Pt stated having vaginal pain and pressure along with contractions.

## 2020-04-23 NOTE — Addendum Note (Signed)
Addended by: Fabian November on: 04/23/2020 11:45 PM   Modules accepted: Orders, SmartSet

## 2020-04-23 NOTE — Patient Instructions (Signed)
COVID 19 Instructions for Scheduled Procedure (Inductions/C-sections and GYN surgeries)   Thank you for choosing [facility] for your services.  You have been scheduled for a procedure called ________Induction of Labor___________________.    Your procedure is scheduled on __________June 19, 2021_____________________.  You are required to have COVID-19 testing performed 2 days prior to your scheduled procedure date.  Testing is performed between 9 AM and 1 PM Monday through Friday.  Please present for testing on                               ______June 17, 2021__________ during this hour. Drive up testing is performed in front of the [facility] (this is next to the [employer]).    Upon your scheduled procedure date, you will need to arrive at the [place] entrance. (There is a statue at the front of this entrance.)   Please arrive on time if you are scheduled for an induction of labor.   If you are scheduled for a Cesarean delivery or for Gyn Surgery, arrive 2 hours prior to your procedure time.   If you are an Obstetric patient and your arrival time falls between 11 PM and 6 AM call L&D [phone]) when you arrive.  A staff member will meet you at the [place] entrance.  At this time, patients are allowed 1 support person to accompany them. Face masks are required for you and your support person. Your support person is now allowed to be there with you during the entire time of your admission.   Please contact the office if you have any questions regarding this information.  The [facility] office number is [phone].     Thank you,    Your [facility] Providers        Labor Induction  Labor induction is when steps are taken to cause a pregnant woman to begin the labor process. Most women go into labor on their own between 37 weeks and 42 weeks of pregnancy. When this does not happen or when there is a medical need for labor to begin, steps  may be taken to induce labor. Labor induction causes a pregnant woman's uterus to contract. It also causes the cervix to soften (ripen), open (dilate), and thin out (efface). Usually, labor is not induced before 39 weeks of pregnancy unless there is a medical reason to do so. Your health care provider will determine if labor induction is needed. Before inducing labor, your health care provider will consider a number of factors, including:  Your medical condition and your baby's.  How many weeks along you are in your pregnancy.  How mature your baby's lungs are.  The condition of your cervix.  The position of your baby.  The size of your birth canal. What are some reasons for labor induction? Labor may be induced if:  Your health or your baby's health is at risk.  Your pregnancy is overdue by 1 week or more.  Your water breaks but labor does not start on its own.  There is a low amount of amniotic fluid around your baby. You may also choose (elect) to have labor induced at a certain time. Generally, elective labor induction is done no earlier than 39 weeks of pregnancy. What methods are used for labor induction? Methods used for labor induction include:  Prostaglandin medicine. This medicine starts contractions and causes the cervix  to dilate and ripen. It can be taken by mouth (orally) or by being inserted into the vagina (suppository).  Inserting a small, thin tube (catheter) with a balloon into the vagina and then expanding the balloon with water to dilate the cervix.  Stripping the membranes. In this method, your health care provider gently separates amniotic sac tissue from the cervix. This causes the cervix to stretch, which in turn causes the release of a hormone called progesterone. The hormone causes the uterus to contract. This procedure is often done during an office visit, after which you will be sent home to wait for contractions to begin.  Breaking the water. In this  method, your health care provider uses a small instrument to make a small hole in the amniotic sac. This eventually causes the amniotic sac to break. Contractions should begin after a few hours.  Medicine to trigger or strengthen contractions. This medicine is given through an IV that is inserted into a vein in your arm. Except for membrane stripping, which can be done in a clinic, labor induction is done in the hospital so that you and your baby can be carefully monitored. How long does it take for labor to be induced? The length of time it takes to induce labor depends on how ready your body is for labor. Some inductions can take up to 2-3 days, while others may take less than a day. Induction may take longer if:  You are induced early in your pregnancy.  It is your first pregnancy.  Your cervix is not ready. What are some risks associated with labor induction? Some risks associated with labor induction include:  Changes in fetal heart rate, such as being too high, too low, or irregular (erratic).  Failed induction.  Infection in the mother or the baby.  Increased risk of having a cesarean delivery.  Fetal death.  Breaking off (abruption) of the placenta from the uterus (rare).  Rupture of the uterus (very rare). When induction is needed for medical reasons, the benefits of induction generally outweigh the risks. What are some reasons for not inducing labor? Labor induction should not be done if:  Your baby does not tolerate contractions.  You have had previous surgeries on your uterus, such as a myomectomy, removal of fibroids, or a vertical scar from a previous cesarean delivery.  Your placenta lies very low in your uterus and blocks the opening of the cervix (placenta previa).  Your baby is not in a head-down position.  The umbilical cord drops down into the birth canal in front of the baby.  There are unusual circumstances, such as the baby being very early  (premature).  You have had more than 2 previous cesarean deliveries. Summary  Labor induction is when steps are taken to cause a pregnant woman to begin the labor process.  Labor induction causes a pregnant woman's uterus to contract. It also causes the cervix to ripen, dilate, and efface.  Labor is not induced before 39 weeks of pregnancy unless there is a medical reason to do so.  When induction is needed for medical reasons, the benefits of induction generally outweigh the risks. This information is not intended to replace advice given to you by your health care provider. Make sure you discuss any questions you have with your health care provider. Document Revised: 11/02/2017 Document Reviewed: 12/13/2016 Elsevier Patient Education  2020 [employer].

## 2020-04-23 NOTE — Progress Notes (Signed)
ROB: Patient noting contractions, mostly at night, lasting ~ 10 minutes. Reiterated signs/symptoms of labor. Discussed IOL by 41 weeks, scheduled for 05/01/2020. Discussed need for COVID testing. No further visits required.

## 2020-04-24 ENCOUNTER — Encounter: Payer: Self-pay | Admitting: Obstetrics and Gynecology

## 2020-04-24 ENCOUNTER — Other Ambulatory Visit: Payer: Self-pay

## 2020-04-24 ENCOUNTER — Inpatient Hospital Stay: Payer: Commercial Managed Care - PPO | Admitting: Anesthesiology

## 2020-04-24 ENCOUNTER — Inpatient Hospital Stay
Admission: RE | Admit: 2020-04-24 | Discharge: 2020-04-26 | DRG: 807 | Disposition: A | Discharge status: Home / Self Care | Payer: Commercial Managed Care - PPO | Attending: Obstetrics and Gynecology | Admitting: Obstetrics and Gynecology

## 2020-04-24 DIAGNOSIS — E669 Obesity, unspecified: Secondary | ICD-10-CM | POA: Diagnosis present

## 2020-04-24 DIAGNOSIS — O99824 Streptococcus B carrier state complicating childbirth: Secondary | ICD-10-CM | POA: Diagnosis present

## 2020-04-24 DIAGNOSIS — Z20822 Contact with and (suspected) exposure to covid-19: Secondary | ICD-10-CM | POA: Diagnosis present

## 2020-04-24 DIAGNOSIS — O35EXX0 Maternal care for other (suspected) fetal abnormality and damage, fetal genitourinary anomalies, not applicable or unspecified: Secondary | ICD-10-CM | POA: Diagnosis present

## 2020-04-24 DIAGNOSIS — O4202 Full-term premature rupture of membranes, onset of labor within 24 hours of rupture: Secondary | ICD-10-CM

## 2020-04-24 DIAGNOSIS — Z8759 Personal history of other complications of pregnancy, childbirth and the puerperium: Secondary | ICD-10-CM

## 2020-04-24 DIAGNOSIS — O358XX0 Maternal care for other (suspected) fetal abnormality and damage, not applicable or unspecified: Secondary | ICD-10-CM | POA: Diagnosis not present

## 2020-04-24 DIAGNOSIS — O4292 Full-term premature rupture of membranes, unspecified as to length of time between rupture and onset of labor: Secondary | ICD-10-CM | POA: Diagnosis present

## 2020-04-24 DIAGNOSIS — O480 Post-term pregnancy: Principal | ICD-10-CM | POA: Diagnosis present

## 2020-04-24 DIAGNOSIS — Z3A39 39 weeks gestation of pregnancy: Secondary | ICD-10-CM

## 2020-04-24 DIAGNOSIS — O99214 Obesity complicating childbirth: Secondary | ICD-10-CM | POA: Diagnosis present

## 2020-04-24 DIAGNOSIS — O26899 Other specified pregnancy related conditions, unspecified trimester: Secondary | ICD-10-CM | POA: Diagnosis not present

## 2020-04-24 DIAGNOSIS — Z6791 Unspecified blood type, Rh negative: Secondary | ICD-10-CM | POA: Diagnosis not present

## 2020-04-24 DIAGNOSIS — O4290 Premature rupture of membranes, unspecified as to length of time between rupture and onset of labor, unspecified weeks of gestation: Secondary | ICD-10-CM | POA: Diagnosis present

## 2020-04-24 DIAGNOSIS — Z8659 Personal history of other mental and behavioral disorders: Secondary | ICD-10-CM

## 2020-04-24 DIAGNOSIS — O26893 Other specified pregnancy related conditions, third trimester: Secondary | ICD-10-CM | POA: Diagnosis present

## 2020-04-24 LAB — RUPTURE OF MEMBRANE (ROM)PLUS: Rom Plus: POSITIVE

## 2020-04-24 LAB — CBC
HCT: 36.1 % (ref 36.0–46.0)
Hemoglobin: 11.2 g/dL — ABNORMAL LOW (ref 12.0–15.0)
MCH: 24 pg — ABNORMAL LOW (ref 26.0–34.0)
MCHC: 31 g/dL (ref 30.0–36.0)
MCV: 77.3 fL — ABNORMAL LOW (ref 80.0–100.0)
Platelets: 230 10*3/uL (ref 150–400)
RBC: 4.67 MIL/uL (ref 3.87–5.11)
RDW: 18.6 % — ABNORMAL HIGH (ref 11.5–15.5)
WBC: 11.7 10*3/uL — ABNORMAL HIGH (ref 4.0–10.5)
nRBC: 0 % (ref 0.0–0.2)

## 2020-04-24 LAB — TYPE AND SCREEN
ABO/RH(D): B NEG
Antibody Screen: POSITIVE

## 2020-04-24 LAB — SARS CORONAVIRUS 2 BY RT PCR (HOSPITAL ORDER, PERFORMED IN ~~LOC~~ HOSPITAL LAB): SARS Coronavirus 2: NEGATIVE

## 2020-04-24 LAB — ABO/RH: ABO/RH(D): B NEG

## 2020-04-24 MED ORDER — LACTATED RINGERS IV SOLN
500.0000 mL | INTRAVENOUS | Status: DC | PRN
  Administered 2020-04-24: 500 mL via INTRAVENOUS

## 2020-04-24 MED ORDER — LACTATED RINGERS IV SOLN
500.0000 mL | Freq: Once | INTRAVENOUS | Status: AC
  Administered 2020-04-24: 500 mL via INTRAVENOUS

## 2020-04-24 MED ORDER — ACETAMINOPHEN 325 MG PO TABS: 650.0000 mg | ORAL_TABLET | ORAL | Status: DC | PRN

## 2020-04-24 MED ORDER — PHENYLEPHRINE 40 MCG/ML (10ML) SYRINGE FOR IV PUSH (FOR BLOOD PRESSURE SUPPORT)
80.0000 ug | PREFILLED_SYRINGE | INTRAVENOUS | Status: DC | PRN
  Filled 2020-04-24: qty 10

## 2020-04-24 MED ORDER — OXYTOCIN-SODIUM CHLORIDE 30-0.9 UT/500ML-% IV SOLN
INTRAVENOUS | Status: AC
  Administered 2020-04-24: 2 m[IU]/min via INTRAVENOUS
  Filled 2020-04-24: qty 500

## 2020-04-24 MED ORDER — LACTATED RINGERS IV SOLN: INTRAVENOUS | Status: DC

## 2020-04-24 MED ORDER — OXYCODONE-ACETAMINOPHEN 5-325 MG PO TABS: 1.0000 | ORAL_TABLET | ORAL | Status: DC | PRN

## 2020-04-24 MED ORDER — MISOPROSTOL 200 MCG PO TABS
ORAL_TABLET | ORAL | Status: AC
  Filled 2020-04-24: qty 4

## 2020-04-24 MED ORDER — PENICILLIN G 3 MILLION UNITS IVPB - SIMPLE MED
3.0000 10*6.[IU] | INTRAVENOUS | Status: DC
Start: 2020-04-24 — End: 2020-04-24

## 2020-04-24 MED ORDER — BUPIVACAINE HCL (PF) 0.25 % IJ SOLN
INTRAMUSCULAR | Status: DC | PRN
  Administered 2020-04-24: 3 mL via EPIDURAL
  Administered 2020-04-24: 5 mL via EPIDURAL

## 2020-04-24 MED ORDER — PENICILLIN G 3 MILLION UNITS IVPB - SIMPLE MED: 3.0000 10*6.[IU] | INTRAVENOUS | Status: DC

## 2020-04-24 MED ORDER — ONDANSETRON HCL 4 MG/2ML IJ SOLN: 4.0000 mg | Freq: Four times a day (QID) | INTRAMUSCULAR | Status: DC | PRN

## 2020-04-24 MED ORDER — FENTANYL 2.5 MCG/ML W/ROPIVACAINE 0.15% IN NS 100 ML EPIDURAL (ARMC)
EPIDURAL | Status: AC
  Filled 2020-04-24: qty 100

## 2020-04-24 MED ORDER — LIDOCAINE HCL (PF) 1 % IJ SOLN
INTRAMUSCULAR | Status: DC | PRN
  Administered 2020-04-24: 4 mL via SUBCUTANEOUS

## 2020-04-24 MED ORDER — PENICILLIN G POT IN DEXTROSE 60000 UNIT/ML IV SOLN
3.0000 10*6.[IU] | INTRAVENOUS | Status: DC
  Administered 2020-04-24: 3 10*6.[IU] via INTRAVENOUS
  Filled 2020-04-24 (×6): qty 50

## 2020-04-24 MED ORDER — AMMONIA AROMATIC IN INHA
RESPIRATORY_TRACT | Status: AC
  Filled 2020-04-24: qty 10

## 2020-04-24 MED ORDER — LIDOCAINE HCL (PF) 1 % IJ SOLN
INTRAMUSCULAR | Status: AC
  Filled 2020-04-24: qty 30

## 2020-04-24 MED ORDER — SOD CITRATE-CITRIC ACID 500-334 MG/5ML PO SOLN: 30.0000 mL | ORAL | Status: DC | PRN

## 2020-04-24 MED ORDER — MISOPROSTOL 100 MCG PO TABS
50.0000 ug | ORAL_TABLET | ORAL | Status: DC | PRN
  Filled 2020-04-24: qty 1

## 2020-04-24 MED ORDER — EPHEDRINE 5 MG/ML INJ
10.0000 mg | INTRAVENOUS | Status: DC | PRN
  Filled 2020-04-24: qty 2

## 2020-04-24 MED ORDER — FENTANYL 2.5 MCG/ML W/ROPIVACAINE 0.15% IN NS 100 ML EPIDURAL (ARMC)
12.0000 mL/h | EPIDURAL | Status: DC
  Administered 2020-04-24: 12 mL/h via EPIDURAL

## 2020-04-24 MED ORDER — OXYTOCIN BOLUS FROM INFUSION
500.0000 mL | Freq: Once | INTRAVENOUS | Status: AC
  Administered 2020-04-25: 500 mL via INTRAVENOUS

## 2020-04-24 MED ORDER — LIDOCAINE HCL (PF) 1 % IJ SOLN: 30.0000 mL | INTRAMUSCULAR | Status: DC | PRN

## 2020-04-24 MED ORDER — LIDOCAINE-EPINEPHRINE (PF) 1.5 %-1:200000 IJ SOLN
INTRAMUSCULAR | Status: DC | PRN
  Administered 2020-04-24: 4 mL via EPIDURAL

## 2020-04-24 MED ORDER — OXYCODONE-ACETAMINOPHEN 5-325 MG PO TABS: 2.0000 | ORAL_TABLET | ORAL | Status: DC | PRN

## 2020-04-24 MED ORDER — OXYTOCIN-SODIUM CHLORIDE 30-0.9 UT/500ML-% IV SOLN: 2.5000 [IU]/h | INTRAVENOUS | Status: DC

## 2020-04-24 MED ORDER — OXYTOCIN-SODIUM CHLORIDE 30-0.9 UT/500ML-% IV SOLN
1.0000 m[IU]/min | INTRAVENOUS | Status: DC
  Filled 2020-04-24: qty 500

## 2020-04-24 MED ORDER — SODIUM CHLORIDE 0.9 % IV SOLN
5.0000 10*6.[IU] | Freq: Once | INTRAVENOUS | Status: AC
  Administered 2020-04-24: 5 10*6.[IU] via INTRAVENOUS
  Filled 2020-04-24: qty 5

## 2020-04-24 MED ORDER — TERBUTALINE SULFATE 1 MG/ML IJ SOLN: 0.2500 mg | Freq: Once | INTRAMUSCULAR | Status: DC | PRN

## 2020-04-24 MED ORDER — OXYTOCIN 10 UNIT/ML IJ SOLN
INTRAMUSCULAR | Status: AC
  Filled 2020-04-24: qty 2

## 2020-04-24 MED ORDER — DIPHENHYDRAMINE HCL 50 MG/ML IJ SOLN: 12.5000 mg | INTRAMUSCULAR | Status: DC | PRN

## 2020-04-24 NOTE — H&P (Signed)
Obstetric History and Physical  Karen Palmer is a 29 y.o. G3P1011 with IUP at [redacted]w[redacted]d presenting for complaints of leaking fluid since this morning at 10:30 a.m. Patient states she has been having  none, irregular, every 5-10 minutes contractions, no vaginal bleeding, ruptured, clear fluid membranes, with active fetal movement.    Prenatal Course Source of Care: [facility] with onset of care at 9 weeks Pregnancy complications or risks: Patient Active Problem List   Diagnosis Date Noted  . Post-dates pregnancy 04/24/2020   She plans to breastfeed, plans to bottle feed She desires vaginal gel inserts (Phexxi) for postpartum contraception.   Prenatal labs and studies: ABO, Rh: --/--/PENDING (06/12 1257) Antibody: PENDING (06/12 1257) Rubella: 5.43 (11/16 1112) RPR: Non Reactive (03/30 0907)  HBsAg: Negative (11/16 1112)  HIV: Non Reactive (11/16 1112)  PPJ:KDTOIZTI/-- (05/25 1535) 1 hr Glucola  normal Genetic screening normal Anatomy US normal   Past Medical History:  Diagnosis Date  . Genital herpes 04/2018   HSV 1 on culture; neg HSV 2 IgG    Past Surgical History:  Procedure Laterality Date  . no surgery history      OB History  Gravida Para Term Preterm AB Living  3 1 1   1 1   SAB TAB Ectopic Multiple Live Births  1       1    # Outcome Date GA Lbr Len/2nd Weight Sex Delivery Anes PTL Lv  3 Current           2 SAB  [redacted]w[redacted]d         1 Term  [redacted]w[redacted]d  2693 g M Vag-Spont  N LIV    Social History   Socioeconomic History  . Marital status: Single    Spouse name: Not on file  . Number of children: Not on file  . Years of education: Not on file  . Highest education level: Not on file  Occupational History  . Not on file  Tobacco Use  . Smoking status: Never Smoker  . Smokeless tobacco: Never Used  Vaping Use  . Vaping Use: Never used  Substance and Sexual Activity  . Alcohol use: No  . Drug use: Never  . Sexual activity: Yes    Birth  control/protection: None  Other Topics Concern  . Not on file  Social History Narrative  . Not on file   Social Determinants of Health   Financial Resource Strain:   . Difficulty of Paying Living Expenses:   Food Insecurity:   . Worried About Charity fundraiser in the Last Year:   . Arboriculturist in the Last Year:   Transportation Needs:   . Film/video editor (Medical):   Marland Kitchen Lack of Transportation (Non-Medical):   Physical Activity:   . Days of Exercise per Week:   . Minutes of Exercise per Session:   Stress:   . Feeling of Stress :   Social Connections:   . Frequency of Communication with Friends and Family:   . Frequency of Social Gatherings with Friends and Family:   . Attends Religious Services:   . Active Member of Clubs or Organizations:   . Attends Archivist Meetings:   Marland Kitchen Marital Status:     Family History  Problem Relation Age of Onset  . Hypertension Sister   . Diabetes Maternal Grandmother   . Hypertension Maternal Grandmother     Medications Prior to Admission  Medication Sig Dispense Refill Last Dose  .  ferrous sulfate (FERROUSUL) 325 (65 FE) MG tablet Take 1 tablet (325 mg total) by mouth daily with breakfast. 60 tablet 1 04/23/2020 at Unknown time  . Prenatal Vit-Fe Fumarate-FA (PRENATAL MULTIVITAMIN) TABS tablet Take 1 tablet by mouth daily at 12 noon.   04/23/2020 at Unknown time    No Known Allergies  Review of Systems: Negative except for what is mentioned in HPI.  Physical Exam: BP 136/86 (BP Location: Right Arm)   Pulse 74   Temp 98.5 F (36.9 C) (Oral)   Resp 20   Ht 5\' 1"  (1.549 m)   Wt 92.5 kg   LMP 07/20/2019   BMI 38.55 kg/m  CONSTITUTIONAL: Well-developed, well-nourished female in no acute distress.  HENT:  Normocephalic, atraumatic, External right and left ear normal. Oropharynx is clear and moist EYES: Conjunctivae and EOM are normal. Pupils are equal, round, and reactive to light. No scleral icterus.  NECK:  Normal range of motion, supple, no masses SKIN: Skin is warm and dry. No rash noted. Not diaphoretic. No erythema. No pallor. NEUROLOGIC: Alert and oriented to person, place, and time. Normal reflexes, muscle tone coordination. No cranial nerve deficit noted. PSYCHIATRIC: Normal mood and affect. Normal behavior. Normal judgment and thought content. CARDIOVASCULAR: Normal heart rate noted, regular rhythm RESPIRATORY: Effort and breath sounds normal, no problems with respiration noted ABDOMEN: Soft, nontender, nondistended, gravid. MUSCULOSKELETAL: Normal range of motion. No edema and no tenderness. 2+ distal pulses.  Cervical Exam: Dilatation 3 cm   Effacement 5-%   Station -3   Presentation: cephalic FHT:  Baseline rate 138 bpm   Variability moderate  Accelerations present   Decelerations none Contractions: Every 2-5 mins, irregular.    Pertinent Labs/Studies:   Results for orders placed or performed during the hospital encounter of 04/24/20 (from the past 24 hour(s))  ROM Plus ([facility] only)     Status: None   Collection Time: 04/24/20 11:47 AM  Result Value Ref Range   Rom Plus POSITIVE   SARS Coronavirus 2 by RT PCR (hospital order, performed in [facility] hospital lab) Nasopharyngeal Nasopharyngeal Swab     Status: None   Collection Time: 04/24/20 12:30 PM   Specimen: Nasopharyngeal Swab  Result Value Ref Range   SARS Coronavirus 2 NEGATIVE NEGATIVE  CBC     Status: Abnormal   Collection Time: 04/24/20 12:57 PM  Result Value Ref Range   WBC 11.7 (H) 4.0 - 10.5 K/uL   RBC 4.67 3.87 - 5.11 MIL/uL   Hemoglobin 11.2 (L) 12.0 - 15.0 g/dL   HCT 06/24/20 36 - 46 %   MCV 77.3 (L) 80.0 - 100.0 fL   MCH 24.0 (L) 26.0 - 34.0 pg   MCHC 31.0 30.0 - 36.0 g/dL   RDW 01.0 (H) 27.2 - 53.6 %   Platelets 230 150 - 400 K/uL   nRBC 0.0 0.0 - 0.2 %  Type and screen     Status: None (Preliminary result)   Collection Time: 04/24/20 12:57 PM  Result Value Ref Range   ABO/RH(D) PENDING    Antibody  Screen PENDING    Sample Expiration      04/27/2020,2359 Performed at [facility], [address], [place], [place]     Assessment : Karen Palmer is a 29 y.o. G3P1011 at [redacted]w[redacted]d being admitted for ruptured membranes at term. History of anxiety. Rh negative status.   Plan: Labor: Expectant management. Augmentation if needed with Pitocin ordered as per protocol. Analgesia as needed. FWB: Reassuring  fetal heart tracing.  GBS positive.  Will treat with PCN for GBS prophylaxis. Renal pyelectasis bilaterally on ultrasounds up to 36 weeks. Will notify Peds at delivery.  Delivery plan: Hopeful for vaginal delivery. Will need Rhogam postpartum. Will need GAD 7 screening postpartum.    Hildred Laser, MD [facility]

## 2020-04-24 NOTE — Progress Notes (Signed)
Intrapartum Progress Note  S: Patient complains of significant pain with contractions, desires epidural.   O: Blood pressure 107/60, pulse 88, temperature 98.3 F (36.8 C), temperature source Oral, resp. rate 18, height 5\' 1"  (1.549 m), weight 92.5 kg, last menstrual period 07/20/2019, SpO2 100 %. Gen App: NAD, comfortable Abdomen: soft, gravid FHT: baseline 125 bpm.  Accels present.  Decels absent. moderate in degree variability.   Tocometer: contractions q 2-3 minutes Cervix: 5/70/-1 Extremities: Nontender, no edema.  Pitocin: 6 mIU  Labs: No new labs   Assessment:  1: SIUP at [redacted]w[redacted]d 2. GBS positive 3. Rh negative status 4. Obesity in pregnancy  Plan:  1. Continue augmentation with Pitocin 2. GBS positive status, continue PCN for prophylaxis.  3. Rhogam postpartum.  4. Anesthesia aware of patient's desire for epidural.    [redacted]w[redacted]d, MD 04/24/2020 7:35 PM

## 2020-04-24 NOTE — OB Triage Note (Signed)
Patient here for LOF felt large gush at 1030 and has continued to leak since. Her contractions are every 5-10 min's and becoming more painful

## 2020-04-24 NOTE — Anesthesia Procedure Notes (Signed)
Epidural Patient location during procedure: OB  Staffing Performed: anesthesiologist   Preanesthetic Checklist Completed: patient identified, IV checked, site marked, risks and benefits discussed, surgical consent, monitors and equipment checked, pre-op evaluation and timeout performed  Epidural Patient position: sitting Prep: Betadine Patient monitoring: heart rate, continuous pulse ox and blood pressure Approach: midline Location: L4-L5 Injection technique: LOR saline  Needle:  Needle type: Tuohy  Needle gauge: 17 G Needle length: 9 cm and 9 Needle insertion depth: 5 cm Catheter type: closed end flexible Catheter size: 19 Gauge Catheter at skin depth: 10 cm Test dose: negative and 1.5% lidocaine with Epi 1:200 K  Assessment Sensory level: T10 Events: blood not aspirated, injection not painful, no injection resistance, no paresthesia and negative IV test  Additional Notes   Patient tolerated the insertion well without complications.-SATD -IVTD. No paresthesia. Refer to [facility] nursing for VS and dosingReason for block:procedure for pain     

## 2020-04-24 NOTE — Anesthesia Preprocedure Evaluation (Signed)
Anesthesia Evaluation  Patient identified by MRN, date of birth, ID band Patient awake    Reviewed: Allergy & Precautions, H&P , NPO status , Patient's Chart, lab work & pertinent test results, reviewed documented beta blocker date and time   Airway Mallampati: II  TM Distance: >3 FB Neck ROM: full    Dental no notable dental hx. (+) Teeth Intact   Pulmonary neg pulmonary ROS, Current Smoker,    Pulmonary exam normal breath sounds clear to auscultation       Cardiovascular Exercise Tolerance: Good negative cardio ROS   Rhythm:regular Rate:Normal     Neuro/Psych negative neurological ROS  negative psych ROS   GI/Hepatic negative GI ROS, Neg liver ROS,   Endo/Other  negative endocrine ROSdiabetes, Gestational  Renal/GU      Musculoskeletal   Abdominal   Peds  Hematology negative hematology ROS (+)   Anesthesia Other Findings   Reproductive/Obstetrics (+) Pregnancy                             Anesthesia Physical Anesthesia Plan  ASA: II  Anesthesia Plan: Epidural   Post-op Pain Management:    Induction:   PONV Risk Score and Plan:   Airway Management Planned:   Additional Equipment:   Intra-op Plan:   Post-operative Plan:   Informed Consent: I have reviewed the patients History and Physical, chart, labs and discussed the procedure including the risks, benefits and alternatives for the proposed anesthesia with the patient or authorized representative who has indicated his/her understanding and acceptance.       Plan Discussed with:   Anesthesia Plan Comments:         Anesthesia Quick Evaluation  

## 2020-04-24 NOTE — Progress Notes (Signed)
Intrapartum Progress Note  S: Patient without complaints.   O: Blood pressure 129/76, pulse 84, temperature 98.2 F (36.8 C), temperature source Oral, resp. rate 18, height 5\' 1"  (1.549 m), weight 92.5 kg, last menstrual period 07/20/2019, SpO2 99 %. Gen App: NAD, comfortable Abdomen: soft, gravid FHT: baseline 155 bpm.  Accels present.  Decels present - recurrent variables from baseline to 80-90s Moderate in degree variability.   Tocometer: contractions q 2-3 minutes Cervix: 10/10/+1 Extremities: Nontender, no edema.  Pitocin: initially stopped, now resumed at 4 mIU.   Labs:  No new labs   Assessment:  1: SIUP at [redacted]w[redacted]d with Category II tracing 2. GBS positive 3. Rh negative status 4. Obesity in pregnancy  Plan:  1. Restarted augmentation with Pitocin.  Amnioinfusion running. Attempted to place FSE however possibly defective cables as could not pick up fetal hear tones. Returned to external fetal monitoring. May require vacuum assistance for delivery for non-reassuring fetal tracing.  2. GBS positive status, continue PCN for prophylaxis. Has been adequately treated.  3. Rhogam postpartum.  4. Anticipate vaginal delivery soon.    [redacted]w[redacted]d, MD 04/24/2020 11:51 PM

## 2020-04-24 NOTE — Progress Notes (Signed)
Intrapartum Progress Note  S: Patient comfortable, with epidural in place.   O: Blood pressure 132/76, pulse 67, temperature 98.2 F (36.8 C), temperature source Oral, resp. rate 18, height 5\' 1"  (1.549 m), weight 92.5 kg, last menstrual period 07/20/2019, SpO2 99 %. Gen App: NAD, comfortable Abdomen: soft, gravid FHT: baseline 145 bpm.  Accels present.  Decels present - recurrent variables from baseline to 90s, lasting for 15-30 seconds, others more shallow. Moderate in degree variability.   Tocometer: contractions q 2-3 minutes Cervix: 6.5/90/-1 to 0 Extremities: Nontender, no edema.  Pitocin: 5 mIU  Labs:  No new labs   Assessment:  1: SIUP at [redacted]w[redacted]d 2. GBS positive 3. Rh negative status 4. Obesity in pregnancy  Plan:  1. Continue augmentation with Pitocin (dose cut in half due to Category II tracing). IUPC placed, will start amnioinfusion.  2. GBS positive status, continue PCN for prophylaxis.  3. Rhogam postpartum.     [redacted]w[redacted]d, MD 04/24/2020 9:49 PM

## 2020-04-25 ENCOUNTER — Encounter: Payer: Self-pay | Admitting: Obstetrics and Gynecology

## 2020-04-25 DIAGNOSIS — Z8659 Personal history of other mental and behavioral disorders: Secondary | ICD-10-CM

## 2020-04-25 DIAGNOSIS — Z6791 Unspecified blood type, Rh negative: Secondary | ICD-10-CM

## 2020-04-25 DIAGNOSIS — O26899 Other specified pregnancy related conditions, unspecified trimester: Secondary | ICD-10-CM

## 2020-04-25 DIAGNOSIS — O4202 Full-term premature rupture of membranes, onset of labor within 24 hours of rupture: Secondary | ICD-10-CM

## 2020-04-25 DIAGNOSIS — O358XX0 Maternal care for other (suspected) fetal abnormality and damage, not applicable or unspecified: Secondary | ICD-10-CM

## 2020-04-25 LAB — RPR: RPR Ser Ql: NONREACTIVE

## 2020-04-25 MED ORDER — SIMETHICONE 80 MG PO CHEW: 80.0000 mg | CHEWABLE_TABLET | ORAL | Status: DC | PRN

## 2020-04-25 MED ORDER — DIBUCAINE (PERIANAL) 1 % EX OINT: 1.0000 "application " | TOPICAL_OINTMENT | CUTANEOUS | Status: DC | PRN

## 2020-04-25 MED ORDER — DIPHENHYDRAMINE HCL 25 MG PO CAPS: 25.0000 mg | ORAL_CAPSULE | Freq: Four times a day (QID) | ORAL | Status: DC | PRN

## 2020-04-25 MED ORDER — ACETAMINOPHEN 325 MG PO TABS: 650.0000 mg | ORAL_TABLET | ORAL | Status: DC | PRN

## 2020-04-25 MED ORDER — BENZOCAINE-MENTHOL 20-0.5 % EX AERO
1.0000 "application " | INHALATION_SPRAY | CUTANEOUS | Status: DC | PRN
  Filled 2020-04-25: qty 56

## 2020-04-25 MED ORDER — SENNOSIDES-DOCUSATE SODIUM 8.6-50 MG PO TABS
2.0000 | ORAL_TABLET | ORAL | Status: DC
  Administered 2020-04-26: 2 via ORAL
  Filled 2020-04-25: qty 2

## 2020-04-25 MED ORDER — WITCH HAZEL-GLYCERIN EX PADS
1.0000 "application " | MEDICATED_PAD | CUTANEOUS | Status: DC | PRN
  Filled 2020-04-25: qty 100

## 2020-04-25 MED ORDER — ZOLPIDEM TARTRATE 5 MG PO TABS: 5.0000 mg | ORAL_TABLET | Freq: Every evening | ORAL | Status: DC | PRN

## 2020-04-25 MED ORDER — PRENATAL MULTIVITAMIN CH
1.0000 | ORAL_TABLET | Freq: Every day | ORAL | Status: DC
  Administered 2020-04-25 – 2020-04-26 (×2): 1 via ORAL
  Filled 2020-04-25 (×2): qty 1

## 2020-04-25 MED ORDER — IBUPROFEN 800 MG PO TABS
800.0000 mg | ORAL_TABLET | Freq: Four times a day (QID) | ORAL | Status: DC
  Administered 2020-04-25 – 2020-04-26 (×6): 800 mg via ORAL
  Filled 2020-04-25 (×6): qty 1

## 2020-04-25 MED ORDER — ONDANSETRON HCL 4 MG PO TABS: 4.0000 mg | ORAL_TABLET | ORAL | Status: DC | PRN

## 2020-04-25 MED ORDER — ONDANSETRON HCL 4 MG/2ML IJ SOLN: 4.0000 mg | INTRAMUSCULAR | Status: DC | PRN

## 2020-04-25 MED ORDER — COCONUT OIL OIL
1.0000 "application " | TOPICAL_OIL | Status: DC | PRN
  Administered 2020-04-25: 1 via TOPICAL
  Filled 2020-04-25: qty 120

## 2020-04-25 NOTE — Anesthesia Postprocedure Evaluation (Signed)
Anesthesia Post Note  Patient: Diahann Guajardo  Procedure(s) Performed: AN AD HOC LABOR EPIDURAL  Patient location during evaluation: Mother Baby Anesthesia Type: Epidural Level of consciousness: awake and alert Pain management: pain level controlled Vital Signs Assessment: post-procedure vital signs reviewed and stable Respiratory status: spontaneous breathing, nonlabored ventilation and respiratory function stable Cardiovascular status: stable Postop Assessment: no headache, no backache and epidural receding Anesthetic complications: no   No complications documented.   Last Vitals:  Vitals:   04/25/20 1214 04/25/20 1545  BP: 115/80 108/68  Pulse: 67 75  Resp: 20 18  Temp: 36.6 C 36.6 C  SpO2: 98% 98%    Last Pain:  Vitals:   04/25/20 0809  TempSrc: Oral  PainSc:                  Yevette Edwards

## 2020-04-25 NOTE — Progress Notes (Signed)
Bell vacuum applied by MD @ 0041 Pressure applied @ 0042 for 42 seconds, then released  Pressure applied @ 0044 for 12 seconds, then off for delivery @ 0044

## 2020-04-25 NOTE — Progress Notes (Signed)
Pt states she would like to pump. Breast pump set up, pt educated on need to pump every 3 hours, how to use pump, how to clean pump. Pt demonstrates use and pumped at 0505.

## 2020-04-25 NOTE — Lactation Note (Signed)
This note was copied from a baby's chart. Lactation Consultation Note  Patient Name: Karen Palmer [id number] Date: 04/25/2020 Reason for consult: Follow-up assessment;Other (Comment) (Mom not consistently pumping)  Mom's choice on admission was to breast and bottle feed, but mom had only bottle fed formula since delivery.  When went in to discuss with mom what her present feeding choice was, she said she just wanted to pump and bottle feed.  Set up Symphony pump for mom at that time with instructions in breast massage, hand expression, pumping, collection, storage, cleaning, labeling and handling of expressed colostrum/breast milk.  Shown how to use manual pump as well.  Mom did not pump any milk at that time, but did get colostrum in flange of pump.  Encouraged mom to rub the colostrum into her nipple to prevent bacteria, lubricate and help with tenderness.  Coconut oil given to rub on flange of pump to help get a better seal and for comfort.  When went back to check on mom 8 hours later she had not pumped again.  Explained supply and demand, set up Symphony again for mom and encouraged her to pump around every 3 hours or 8 or more times in 24 hours to bring in mature milk, ensure a plentiful milk supply and prevent engorgement.  Hand out given on formula preparation and reviewed.  Reviewed normal newborn stomach size, adequate intake and out put, normal course of lactation and routine newborn feeding pattern.  Mom did not pump with first baby, who is 15 years old now, but did breast feed.  Mom does not have a pump at home.  AEROFLOW information given and discussed how to get DEBP through [facility] insurance.  Lactation Government social research officer given with contact numbers and reviewed and lactation name and number written on white board and encouraged to call with any questions, concerns or assistance. Maternal Data Formula Feeding for Exclusion: No (breast and bottle feeding; pumping) Has patient been  taught Hand Expression?: Yes Does the patient have breastfeeding experience prior to this delivery?: Yes  Feeding    LATCH Score                   Interventions Interventions: Expressed milk;Coconut oil;DEBP  Lactation Tools Discussed/Used Tools: Pump;Coconut oil;Bottle Breast pump type: Double-Electric Breast Pump WIC Program: No [facility]) Pump Review: Setup, frequency, and cleaning;Milk Storage;Other (comment) Initiated by:: S.Birtie Fellman,RN,BSN,IBCLC Date initiated:: 04/25/20   Consult Status Consult Status: PRN    Louis Meckel 04/25/2020, 9:56 PM

## 2020-04-26 DIAGNOSIS — O4290 Premature rupture of membranes, unspecified as to length of time between rupture and onset of labor, unspecified weeks of gestation: Secondary | ICD-10-CM | POA: Diagnosis present

## 2020-04-26 DIAGNOSIS — Z8659 Personal history of other mental and behavioral disorders: Secondary | ICD-10-CM

## 2020-04-26 DIAGNOSIS — Z6791 Unspecified blood type, Rh negative: Secondary | ICD-10-CM | POA: Diagnosis present

## 2020-04-26 DIAGNOSIS — O35EXX0 Maternal care for other (suspected) fetal abnormality and damage, fetal genitourinary anomalies, not applicable or unspecified: Secondary | ICD-10-CM | POA: Diagnosis present

## 2020-04-26 DIAGNOSIS — Z8759 Personal history of other complications of pregnancy, childbirth and the puerperium: Secondary | ICD-10-CM

## 2020-04-26 MED ORDER — IBUPROFEN 800 MG PO TABS: 800.0000 mg | ORAL_TABLET | Freq: Three times a day (TID) | ORAL | 0 refills | Status: AC | PRN

## 2020-04-26 NOTE — Progress Notes (Signed)
Post Partum Day # 1, s/p SVD  Subjective: no complaints, up ad lib, voiding and tolerating PO.  Is pumping and formula feeding. Has been seen by lactation.   Objective: Temp:  [97.8 F (36.6 C)-98.5 F (36.9 C)] 98 F (36.7 C) (06/14 0814) Pulse Rate:  [67-87] 74 (06/14 0814) Resp:  [18-20] 20 (06/14 0814) BP: (108-125)/(68-80) 125/77 (06/14 0814) SpO2:  [98 %-100 %] 100 % (06/14 0814)  Physical Exam:  General: alert and no distress  Lungs: clear to auscultation bilaterally Breasts: normal appearance, no masses or tenderness Heart: regular rate and rhythm, S1, S2 normal, no murmur, click, rub or gallop Abdomen: soft, non-tender; bowel sounds normal; no masses,  no organomegaly Pelvis: Lochia: appropriate, Uterine Fundus: firm Extremities: DVT Evaluation: No evidence of DVT seen on physical exam. Negative Homan's sign. No cords or calf tenderness. No significant calf/ankle edema.   Recent Labs    04/24/20 1257  HGB 11.2*  HCT 36.1    Assessment/Plan: Doing well postpartum  Breastfeeding (pumping) and formula feeding, is s/p Lactation consult. Circumcision recently performed, educated patient on care tips.  Contraception: considering Phexxi Rh negative, for Rhogam postpartum if indicated.  H/o anxiety, will need GAD 7 completed prior to discharge. Has been asymptomatic during pregnancy.  Discharge home today.    LOS: 2 days   Hildred Laser, MD [facility] 04/26/2020 9:09 AM

## 2020-04-26 NOTE — Discharge Instructions (Signed)

## 2020-04-26 NOTE — Discharge Summary (Signed)
Postpartum Discharge Summary     Patient Name: Karen Palmer DOB: [birth year] MRN: [id number]  Date of admission: 04/24/2020 Delivery date:04/25/2020  Delivering provider: Rubie Maid  Date of discharge: 04/26/2020  Admitting diagnosis: PROM (premature rupture of membranes) Intrauterine pregnancy: 102w0d    Secondary diagnosis:  Principal Problem:   PROM (premature rupture of membranes) Active Problems:   Rh negative state in antepartum period, third trimester   History of prior pregnancy with small for gestational age newborn   History of anxiety   Pyelectasis of fetus on prenatal ultrasound  Additional problems: None    Discharge diagnosis: Term Pregnancy Delivered                                              Post partum procedures: None. Rhogam not indicated based on neonate's blood type Augmentation: Pitocin Complications: None  Hospital course: Onset of Labor With Vaginal Delivery      29y.o. yo [id number] admitted in Latent Labor with PROM on 04/24/2020. Patient had an uncomplicated labor course as follows:  Membrane Rupture Time/Date:  ,04/24/2020   Delivery Method:Vaginal, Vacuum (Extractor)  Episiotomy: None  Lacerations:  None  Patient had an uncomplicated postpartum course.  She is ambulating, tolerating a regular diet, passing flatus, and urinating well. Patient is discharged home in stable condition on 04/26/20.  Newborn Data: Birth date:04/25/2020  Birth time:12:44 AM  Gender:Female  Living status:Living  Apgars:6 ,8  Weight:3240 g   Magnesium Sulfate received: No BMZ received: No Rhophylac:No (not indicated based on neonate blood type) MMR:No T-DaP:Given prenatally Flu: No Transfusion:No  Physical exam  Vitals:   04/25/20 1545 04/25/20 2040 04/25/20 2333 04/26/20 0814  BP: 108/68 122/72 118/72 125/77  Pulse: 75 78 87 74  Resp: 18 18 20 20   Temp: 97.9 F (36.6 C) 98.5 F (36.9 C) 97.9 F (36.6 C) 98 F (36.7 C)  TempSrc:  Oral   Oral  SpO2: 98% 99% 98% 100%  Weight:      Height:       General: alert, cooperative and no distress Lochia: appropriate Uterine Fundus: firm Incision: N/A DVT Evaluation: No evidence of DVT seen on physical exam. Negative Homan's sign. No cords or calf tenderness. No significant calf/ankle edema.    Labs: Lab Results  Component Value Date   WBC 11.7 (H) 04/24/2020   HGB 11.2 (L) 04/24/2020   HCT 36.1 04/24/2020   MCV 77.3 (L) 04/24/2020   PLT 230 04/24/2020   CMP Latest Ref Rng & Units 03/23/2014  Glucose 65 - 99 mg/dL 89  BUN 7 - 18 mg/dL 6(L)  Creatinine 0.60 - 1.30 mg/dL 0.45(L)  Sodium 136 - 145 mmol/L 137  Potassium 3.5 - 5.1 mmol/L 3.8  Chloride 98 - 107 mmol/L 105  CO2 21 - 32 mmol/L 25  Calcium 8.5 - 10.1 mg/dL 8.5  Total Protein 6.4 - 8.2 g/dL 7.8  Total Bilirubin 0.2 - 1.0 mg/dL 0.2  Alkaline Phos Unit/L 43(L)  AST 15 - 37 Unit/L 14(L)  ALT 12 - 78 U/L 16   Edinburgh Score: Edinburgh Postnatal Depression Scale Screening Tool 04/25/2020  I have been able to laugh and see the funny side of things. 0  I have looked forward with enjoyment to things. 0  I have blamed myself unnecessarily when things went wrong. 2  I  have been anxious or worried for no good reason. 0  I have felt scared or panicky for no good reason. 0  Things have been getting on top of me. 2  I have been so unhappy that I have had difficulty sleeping. 0  I have felt sad or miserable. 2  I have been so unhappy that I have been crying. 1  The thought of harming myself has occurred to me. 0  Edinburgh Postnatal Depression Scale Total 7      After visit meds:  Allergies as of 04/26/2020   No Known Allergies      Medication List     TAKE these medications    ferrous sulfate 325 (65 FE) MG tablet Commonly known as: FerrouSul Take 1 tablet (325 mg total) by mouth daily with breakfast.   ibuprofen 800 MG tablet Commonly known as: ADVIL Take 1 tablet (800 mg total) by mouth every 8  (eight) hours as needed.   prenatal multivitamin Tabs tablet Take 1 tablet by mouth daily at 12 noon.         Discharge home in stable condition Infant Feeding: Bottle and Breast (pumping) Infant Disposition:home with mother Discharge instruction: per After Visit Summary and Postpartum booklet. Activity: Advance as tolerated. Pelvic rest for 6 weeks.  Diet: routine diet Anticipated Birth Control:  considering contraceptive gel Postpartum Appointment:6 weeks Additional Postpartum F/U:  None Future Appointments:No future appointments. Follow up Visit:  Follow-up Information     Rubie Maid, MD. Schedule an appointment as soon as possible for a visit in 6 week(s).   Specialties: Obstetrics and Gynecology, Radiology Why: Postpartum visit Contact information: [place] [address] [phone]                     04/26/2020 Rubie Maid, MD [facility]

## 2020-04-26 NOTE — Progress Notes (Signed)
Pt discharged with infant. Discharge instructions, prescriptions, and follow up appointments given to and reviewed with patient. Pt verbalized understanding. Escorted out by auxillary.  

## 2020-04-27 ENCOUNTER — Inpatient Hospital Stay: Admit: 2020-04-27 | Payer: Self-pay

## 2020-06-09 ENCOUNTER — Encounter: Payer: Self-pay | Admitting: Obstetrics and Gynecology

## 2020-06-09 ENCOUNTER — Ambulatory Visit (INDEPENDENT_AMBULATORY_CARE_PROVIDER_SITE_OTHER): Payer: Commercial Managed Care - PPO | Admitting: Obstetrics and Gynecology

## 2020-06-09 DIAGNOSIS — O924 Hypogalactia: Secondary | ICD-10-CM

## 2020-06-09 DIAGNOSIS — N644 Mastodynia: Secondary | ICD-10-CM

## 2020-06-09 MED ORDER — PHEXXI 1.8-1-0.4 % VA GEL: 1.0000 | VAGINAL | 11 refills | Status: AC | PRN

## 2020-06-09 NOTE — Progress Notes (Signed)
   OBSTETRICS POSTPARTUM CLINIC PROGRESS NOTE  Subjective:     Karen Palmer is a 29 y.o. [id number] female who presents for a postpartum visit. She is 6 weeks postpartum following a spontaneous vaginal delivery (vacuum assisted). I have fully reviewed the prenatal and intrapartum course. The delivery was at 40 gestational weeks.  Anesthesia: epidural. Postpartum course has been well. Baby's course has been well. Baby is feeding by breast. Bleeding: patient has/has not resumed menses, with No LMP recorded.. Bowel function is normal. Bladder function is normal. Patient is sexually active (initial episode 2 days ago, used withdrawal method). Contraception method desired is Phexxi vaginal inserts. Postpartum depression screening: negative.  The following portions of the patient's history were reviewed and updated as appropriate: allergies, current medications, past family history, past medical history, past social history, past surgical history and problem list.  Review of Systems A comprehensive review of systems was negative except for: Integument/breast: positive for breast tenderness and decreased milk supply of left breast. Also feels lumps in her breasts (L>R)  Objective:    BP 111/78   Pulse 73   Ht 5\' 1"  (1.549 m)   Wt 182 lb 9.6 oz (82.8 kg)   Breastfeeding Yes   BMI 34.50 kg/m   General:  alert and no distress   Breasts:  inspection negative, no nipple discharge or bleeding,  Palpable ducts and nodules at inner lower quadrants.   Lungs: clear to auscultation bilaterally  Heart:  regular rate and rhythm, S1, S2 normal, no murmur, click, rub or gallop  Abdomen: soft, non-tender; bowel sounds normal; no masses,  no organomegaly.     Vulva:  normal  Vagina: normal vagina, no discharge, exudate, lesion, or erythema  Cervix:  no cervical motion tenderness and no lesions  Corpus: normal size, contour, position, consistency, mobility, non-tender  Adnexa:  normal adnexa and no mass,  fullness, tenderness  Rectal Exam: Not performed.         Labs:  Lab Results  Component Value Date   HGB 11.2 (L) 04/24/2020     Assessment:   Postpartum care following vaginal delivery Decreased milk production Breast tenderness  Plan:   1. Contraception: Phexxi vaginal inserts.  2.  Decreased milk production, tried lactation cookies;  given handout on alternative supplements.  3. Breast tenderness with palpable nodules. Discussed use of warm compresses and breast massage prior to feeding/pumping. Also encouraged patient to f/u with lactation consultant.  3. Follow up in: 4-6 months for annual exam.     06/24/2020, MD [facility]

## 2020-06-09 NOTE — Patient Instructions (Signed)
Breastfeeding and Low Milk Supply Breastfeeding can be challenging, especially during the first few weeks after childbirth. It is normal to have some problems when you start to breastfeed your new baby, even if you have breastfed before. The amount of breast milk you produce is based on a supply-and-demand system. You may have a low milk supply if your baby is not effectively emptying your breast. Work with your health care provider or a breastfeeding specialist (Advertising copywriter) to help overcome breastfeeding challenges. How does this affect me? You may have other breastfeeding challenges if your baby has problems attaching to your nipple (a poor latch). How does this affect my baby? If you have a low milk supply, your baby may have trouble gaining weight. Follow these instructions at home: When to breastfeed your baby  Breastfeed when you feel the need to reduce the fullness of your breasts or when your baby shows signs of hunger. This is called "breastfeeding on demand."  Do not delay feedings. Feed your baby frequently. General instructions   Try to empty your breasts of milk at each feeding. Emptying your breasts signals them to produce more milk.  If your breast does not empty completely during a feeding, use a pump or squeeze with your hand (hand express) to get any remaining milk out.  Make sure your baby is latched on to your breast and positioned properly when breastfeeding.  Try different breastfeeding positions to find one that helps your baby feed more effectively.  Do not give your baby extra formula (supplemental feedings) unless your health care provider or lactation consultant tells you to do that. Medicines  Let your health care provider know what over-the-counter or prescription medicines you are taking. Some medicines may affect your milk supply.  Talk to your health care provider or lactation consultant before taking any herbal supplements. Contact a health  care provider if:  Your baby does not gain weight or loses weight.  You continue to have a low milk supply after following advice. Summary  You may have a low milk supply if your baby is not effectively emptying your breast. This may cause your baby to not gain enough weight. Contact your health care provider if your baby does not gain weight or if you continue to have a low milk supply after taking steps to address it.  To help with low milk supply, breastfeed when your baby shows signs of hunger, and pump after feeding to completely empty your breasts. Do not give your baby extra formula (supplemental feedings) unless your health care provider or lactation consultant tells you to do that.  Always make sure your baby is latched and positioned properly when breastfeeding. Try different positions to find one that works best for you and your baby. This information is not intended to replace advice given to you by your health care provider. Make sure you discuss any questions you have with your health care provider. Document Revised: 02/19/2019 Document Reviewed: 06/06/2017 Elsevier Patient Education  2020 [employer].

## 2020-06-09 NOTE — Progress Notes (Signed)
   PT is present today for her postpartum visit. Pt stated that she is breastfeeding and have had  sexually intercourse recently and did not use any protection. Pt stated that she would like to get birth control. UPT=NEG. EPDS= 0.  Pt c/o of breast tenderness and pain.

## 2020-08-16 ENCOUNTER — Other Ambulatory Visit: Payer: Commercial Managed Care - PPO

## 2020-08-16 DIAGNOSIS — Z20822 Contact with and (suspected) exposure to covid-19: Secondary | ICD-10-CM

## 2020-08-18 LAB — NOVEL CORONAVIRUS, NAA: SARS-CoV-2, NAA: NOT DETECTED

## 2020-08-18 LAB — SARS-COV-2, NAA 2 DAY TAT

## 2020-10-04 ENCOUNTER — Emergency Department
Admission: EM | Admit: 2020-10-04 | Discharge: 2020-10-04 | Disposition: A | Discharge status: Home / Self Care | Payer: Commercial Managed Care - PPO

## 2020-10-12 ENCOUNTER — Encounter: Payer: Commercial Managed Care - PPO | Admitting: Obstetrics and Gynecology

## 2020-10-20 ENCOUNTER — Encounter: Payer: Self-pay | Admitting: Obstetrics and Gynecology

## 2020-10-20 ENCOUNTER — Ambulatory Visit (INDEPENDENT_AMBULATORY_CARE_PROVIDER_SITE_OTHER): Payer: Commercial Managed Care - PPO | Admitting: Obstetrics and Gynecology

## 2020-10-20 ENCOUNTER — Other Ambulatory Visit: Payer: Self-pay

## 2020-10-20 VITALS — BP 109/74 | HR 101 | Ht 61.0 in | Wt 179.0 lb

## 2020-10-20 DIAGNOSIS — N938 Other specified abnormal uterine and vaginal bleeding: Secondary | ICD-10-CM

## 2020-10-20 DIAGNOSIS — R1033 Periumbilical pain: Secondary | ICD-10-CM

## 2020-10-20 NOTE — Progress Notes (Signed)
Pt present from ED follow up. Pt stated having abd pain x several weeks before going to the ED.

## 2020-10-20 NOTE — Progress Notes (Signed)
GYNECOLOGY PROGRESS NOTE  Subjective:    Patient ID: Karen Palmer, female    DOB: 13-Aug-1991, 29 y.o.   MRN: [id number]  HPI  Patient is a 29 y.o. [id number] female who presents for follow from [facility] Emergency Room visit on 10/04/2020.  She was seen due to complaints of abdominal pain (umbilical) that had been present for 4 days, along with nausea and a prolonged menstrual cycle. She was noted to have a small focal fat infarction vs epiploic appendagitis of the omentum.  She reports that upon leaving the ER, her bleeding improved and has not had an issues since that time. Using Phexxi for contraception. UPT in ER was negative.   Additionally, she notes leg pain with prolonged standing.  Does also endorse some back pain as well.  Pain feels more achy and deep within the leg along the front.   Lastly, she reports that her infant was treated for thrush several times over the past few months. Stopped breastfeeding 1-2 months ago as she thought this would help. Reports her baby was treated, but she never received any treatment.  Wonders if she still needs to be treated now that she is not breastfeeding. Denies any breast complaints today.    The following portions of the patient's history were reviewed and updated as appropriate: allergies, current medications, past family history, past medical history, past social history, past surgical history and problem list.  Review of Systems Pertinent items noted in HPI and remainder of comprehensive ROS otherwise negative.   Objective:   Blood pressure 109/74, pulse (!) 101, height 5\' 1"  (1.549 m), weight 179 lb (81.2 kg), last menstrual period 09/28/2020, currently breastfeeding. General appearance: alert and no distress  Breasts: breasts appear normal, no suspicious masses, no skin or nipple changes or axillary nodes. 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   Minimal hydrosalpinx, free fluid, endometrial fluid, and ovarian follicles are likely secondary to physiologic changes of proliferative phase.   ====================  ADDENDUM (10/05/2020 9:28 AM):  On review, the following additional findings were noted:  --Agree with findings of focal fat infarction along the upper omentum superior to the transverse colon which given the imaging appearance is favored to represent epiploic appendagitis over omental infarct.  --Small-volume pelvic free fluid, possibly physiologic.  --Prominent ovaries and endometrial fluid are likely within normal limits for patient's age. Subcentimeter peripherally enhancing structure at the right uterine fundus (2: 115), potentially a small peripherally enhancing fibroid for focally dilated tube however too small to characterize.  Narrative Performed by [facility] This result has an attachment that is not available.  EXAM: CT ABDOMEN PELVIS W CONTRAST  DATE: 10/05/2020 12:43 AM  ACCESSION: 10/07/2020 UN  DICTATED: 10/05/2020 12:52 AM  INTERPRETATION LOCATION: [facility]   CLINICAL INDICATION: periumbilical pain ; Epigastric pain    COMPARISON: None   TECHNIQUE: A spiral CT scan of the abdomen and pelvis was obtained with IV contrast from the lung bases through the pubic symphysis. Images were reconstructed in the axial plane. Coronal and sagittal reformatted images were also provided for further evaluation.   FINDINGS:   LINES AND TUBES: None.   LOWER THORAX: Subsegmental atelectasis.10/07/2020   HEPATOBILIARY: No focal hepatic lesions. The gallbladder is present and otherwise unremarkable. No  biliary dilatation.   SPLEEN: Unremarkable.  PANCREAS: Unremarkable.   ADRENALS: Unremarkable.  KIDNEYS/URETERS:  Unremarkable.   BLADDER: Unremarkable.  PELVIC/REPRODUCTIVE ORGANS: Minimal hydrosalpinx. Trace free fluid. Bilateral physiologic ovarian follicles and endometrial fluid.    GI TRACT: No dilated or thick walled loops of bowel. The appendix is not visualized. There are no secondary signs of appendicitis.   PERITONEUM/RETROPERITONEUM AND MESENTERY: Focus of mesenteric fat in the periumbilical region with surrounding stranding, compatible with fatty infarct.No free air or fluid.  LYMPH NODES: No enlarged lymph nodes.  VESSELS: The aorta is normal in caliber. No significant calcified atherosclerotic disease. The portal venous system is patent. The hepatic veins and IVC are unremarkable.   BONES AND SOFTTISSUES: Unremarkable.   Assessment:   1. Periumbilical abdominal pain   2. DUB (dysfunctional uterine bleeding)   3. Thrush, newborn    Plan:   1. Periumbilical abdominal pain - has resolved.  Unclear cause of findings on CT scan.  Advised to follow up if pain returns or worsens. Reviewed labs in Care Everywhere.  2.  DUB (dysfunctional uterine bleeding) - Improved after ER visit. Advised to inform if abnormal bleeding continues.  3. Reports infant with several episodes of thrush, was treated. Patient inquires if she needs to be treated. Breast exam appears normal. No longer breastfeeding. Will hold off on treatment at this time. If she notices any symptoms in the near future, will prescribe antibiotics.  4. RTC in 3-4 months for annual exam.    Hildred Laser, MD [facility]

## 2021-03-01 ENCOUNTER — Encounter: Payer: Commercial Managed Care - PPO | Admitting: Obstetrics and Gynecology

## 2021-03-18 ENCOUNTER — Encounter: Payer: Self-pay | Admitting: Obstetrics and Gynecology

## 2023-03-15 ENCOUNTER — Ambulatory Visit: Payer: Commercial Managed Care - PPO | Admitting: Family Medicine

## 2023-03-19 ENCOUNTER — Ambulatory Visit: Payer: Commercial Managed Care - PPO
